# Patient Record
Sex: Female | Born: 1965 | Race: White | Hispanic: No | Marital: Married | Smoking: Never smoker
Health system: Southern US, Community
[De-identification: ages and names within clinical notes are randomized; demographics above are authoritative.]

## PROBLEM LIST (undated history)

## (undated) DIAGNOSIS — M1990 Unspecified osteoarthritis, unspecified site: Secondary | ICD-10-CM

## (undated) DIAGNOSIS — N951 Menopausal and female climacteric states: Secondary | ICD-10-CM

## (undated) DIAGNOSIS — G2581 Restless legs syndrome: Secondary | ICD-10-CM

## (undated) DIAGNOSIS — I10 Essential (primary) hypertension: Secondary | ICD-10-CM

## (undated) DIAGNOSIS — K219 Gastro-esophageal reflux disease without esophagitis: Secondary | ICD-10-CM

## (undated) HISTORY — DX: Gastro-esophageal reflux disease without esophagitis: K21.9

## (undated) HISTORY — DX: Menopausal and female climacteric states: N95.1

## (undated) HISTORY — DX: Unspecified osteoarthritis, unspecified site: M19.90

## (undated) HISTORY — DX: Essential (primary) hypertension: I10

## (undated) HISTORY — DX: Restless legs syndrome: G25.81

---

## 2019-03-12 ENCOUNTER — Encounter: Payer: Self-pay | Admitting: Nurse Practitioner

## 2019-05-08 ENCOUNTER — Other Ambulatory Visit: Payer: Self-pay | Admitting: Family Medicine

## 2019-05-08 NOTE — Telephone Encounter (Signed)
Your patient 

## 2019-06-06 ENCOUNTER — Other Ambulatory Visit: Payer: Self-pay | Admitting: Family Medicine

## 2019-07-03 ENCOUNTER — Other Ambulatory Visit: Payer: Self-pay

## 2019-07-05 ENCOUNTER — Other Ambulatory Visit: Payer: Self-pay

## 2019-08-09 ENCOUNTER — Other Ambulatory Visit: Payer: Self-pay | Admitting: Family Medicine

## 2019-09-06 ENCOUNTER — Other Ambulatory Visit: Payer: Self-pay | Admitting: Family Medicine

## 2019-09-18 ENCOUNTER — Other Ambulatory Visit: Payer: Self-pay

## 2019-09-18 ENCOUNTER — Telehealth (INDEPENDENT_AMBULATORY_CARE_PROVIDER_SITE_OTHER): Payer: Self-pay | Admitting: Family Medicine

## 2019-09-18 ENCOUNTER — Encounter: Payer: Self-pay | Admitting: Family Medicine

## 2019-09-18 ENCOUNTER — Telehealth: Payer: Self-pay

## 2019-09-18 ENCOUNTER — Other Ambulatory Visit (INDEPENDENT_AMBULATORY_CARE_PROVIDER_SITE_OTHER): Payer: Self-pay

## 2019-09-18 VITALS — Temp 97.7°F | Ht 65.0 in | Wt 235.0 lb

## 2019-09-18 DIAGNOSIS — R05 Cough: Secondary | ICD-10-CM

## 2019-09-18 DIAGNOSIS — R059 Cough, unspecified: Secondary | ICD-10-CM | POA: Insufficient documentation

## 2019-09-18 DIAGNOSIS — K219 Gastro-esophageal reflux disease without esophagitis: Secondary | ICD-10-CM | POA: Insufficient documentation

## 2019-09-18 LAB — POC COVID19 BINAXNOW: SARS Coronavirus 2 Ag: POSITIVE — AB

## 2019-09-18 MED ORDER — BENZONATATE 200 MG PO CAPS: 200.0000 mg | ORAL_CAPSULE | Freq: Two times a day (BID) | ORAL | 0 refills | Status: DC | PRN

## 2019-09-18 NOTE — Progress Notes (Signed)
Virtual Visit via Telephone Note   This visit type was conducted due to national recommendations for restrictions regarding the COVID-19 Pandemic (e.g. social distancing) in an effort to limit this patient's exposure and mitigate transmission in our community.  Due to her co-morbid illnesses, this patient is at least at moderate risk for complications without adequate follow up.  This format is felt to be most appropriate for this patient at this time.  The patient did not have access to video technology/had technical difficulties with video requiring transitioning to audio format only (telephone).  All issues noted in this document were discussed and addressed.  No physical exam could be performed with this format.  Patient verbally consented to a telehealth visit.   Date:  09/18/2019   ID:  Gina Combs, DOB [birth year], MRN [id number] Pt at home Provider in office  PCP:  Daryll Drown, NP   Evaluation Performed: Televisit  Chief Complaint: concern for COVID-stomach upset with constipation, congestion, headache.   History of Present Illness:    Gina Combs is a 54 y.o. female with URI symptoms. Patient has been exposed to Covid. Her daughter and daughter's boyfriend were both Covid + (delta variant) and their 10 days of quarantine expired on Monday. Patient started having symptoms on Saturday. She states she is unsure if she has a "stomach bug" or could possibly have Covid.   The patient doeshave symptoms concerning for COVID-19 infection (no fever or chills. Has a dry cough, some wheezing).  Lives with husband, daughter and daughter's boy friend Social History   Socioeconomic History  . Marital status: Married    Spouse name: Not on file  . Number of children: Not on file  . Years of education: Not on file  . Highest education level: Not on file  Occupational History  . Not on file  Tobacco Use  . Smoking status: Never Smoker  . Smokeless tobacco: Never Used  Substance and  Sexual Activity  . Alcohol use: Never  . Drug use: Never  . Sexual activity: Not on file  Other Topics Concern  . Not on file  Social History Narrative  . Not on file   Social Determinants of Health   Financial Resource Strain:   . Difficulty of Paying Living Expenses:   Food Insecurity:   . Worried About Programme researcher, broadcasting/film/video in the Last Year:   . Barista in the Last Year:   Transportation Needs:   . Freight forwarder (Medical):   Marland Kitchen Lack of Transportation (Non-Medical):   Physical Activity:   . Days of Exercise per Week:   . Minutes of Exercise per Session:   Stress:   . Feeling of Stress :   Social Connections:   . Frequency of Communication with Friends and Family:   . Frequency of Social Gatherings with Friends and Family:   . Attends Religious Services:   . Active Member of Clubs or Organizations:   . Attends Banker Meetings:   Marland Kitchen Marital Status:   Intimate Partner Violence:   . Fear of Current or Ex-Partner:   . Emotionally Abused:   Marland Kitchen Physically Abused:   . Sexually Abused:     Outpatient Medications Prior to Visit  Medication Sig Dispense Refill  . etodolac (LODINE) 400 MG tablet Take 400 mg by mouth 2 (two) times daily.     No facility-administered medications prior to visit.   Allergies:   Patient has no allergy information on record.  Social History   Tobacco Use  . Smoking status: Never Smoker  . Smokeless tobacco: Never Used  Substance Use Topics  . Alcohol use: Never  . Drug use: Never     Review of Systems  Constitutional: Positive for diaphoresis. Negative for chills and fever.  HENT: Positive for ear pain (Left ear-improved, seen by telehealth last week), sinus pain (sinus Pressure) and sore throat (dry throat). Negative for congestion.        Thrush  Respiratory: Positive for cough (dry) and wheezing.   Cardiovascular: Positive for chest pain (sore from coughing).  Gastrointestinal: Positive for constipation,  heartburn and nausea. Negative for diarrhea and vomiting.       Drank prune juice  Musculoskeletal: Positive for back pain.  Neurological: Positive for dizziness and headaches.  Psychiatric/Behavioral: The patient has insomnia.      Labs/Other Tests and Data Reviewed:     Wt Readings from Last 3 Encounters:  09/18/19 235 lb (106.6 kg)     Objective:    Vital Signs-no fever, bp at home130/70  ASSESSMENT & PLAN:    1. Cough Concern for COVID with + exposure-daughter lives in pts home-10 days since diagnosis-rapid test today at office-if negative confirmation testing.  COVID-19 Education: The signs and symptoms of COVID-19 were discussed with the patient and how to seek care for testing pt to come today. The importance of social distancing was discussed today. GERD-increase omeprazole to 2-20mg  today, continue gas-x. Avoid fried and fast foods.   Time:   Today, I have spent with the patient with telehealth technology discussing the above problems.    Follow Up:  COVID testing today at office Recommended COVIDvaccine if not +  Signed, Horald Pollen, CMA  09/18/2019 8:39 AM    [facility]

## 2019-09-18 NOTE — Telephone Encounter (Signed)
Patient called to state that her rib cage was sore and felt pressure (gas like) and wanted to make sure this was normal. Advised pt that it is normal to be sore from coughing but should she have SOB or chest pain to go to the ER. Patient stated she has been belching so she knows the pressure if gas related however, any other questions she may have she will contact our office.

## 2019-09-18 NOTE — Addendum Note (Signed)
Addended by: Wandra Feinstein on: 09/18/2019 04:05 PM   Modules accepted: Orders

## 2019-09-18 NOTE — Progress Notes (Signed)
Patient Name: Gina Combs Date of Birth: [birth year] MRN:  [id number]  Gina Combs is a 54 y.o. yo female presenting for COVID-19 testing.  She is being tested from the vehicle.  Gina Combs is being tested with the rapid test.  It resulted POSITIVE.  Patient aware to contact office or go to the ED with worsening symptoms.    Jacklynn Bue, LPN 22:02 AM

## 2019-10-09 ENCOUNTER — Other Ambulatory Visit: Payer: Self-pay | Admitting: Family Medicine

## 2019-10-14 ENCOUNTER — Other Ambulatory Visit: Payer: Self-pay | Admitting: Family Medicine

## 2019-12-12 ENCOUNTER — Other Ambulatory Visit: Payer: Self-pay | Admitting: Legal Medicine

## 2020-02-04 ENCOUNTER — Telehealth: Payer: Self-pay

## 2020-02-04 ENCOUNTER — Other Ambulatory Visit: Payer: Self-pay

## 2020-02-04 MED ORDER — VALSARTAN-HYDROCHLOROTHIAZIDE 80-12.5 MG PO TABS
1.0000 | ORAL_TABLET | Freq: Every day | ORAL | 0 refills | Status: DC
Start: 2020-02-04 — End: 2020-03-06

## 2020-02-04 NOTE — Telephone Encounter (Signed)
Gina Combs called to request a refill on her bp medication.  She is currently out of town with a sick family member and she is unable to come in until late in the month.  Dr. Judee Clara approved 1 month refill and follow-up before any additional refills can be given.

## 2020-02-06 NOTE — Telephone Encounter (Signed)
Pt called again leaving VM asking for status. States she is working from home and can leave VM. Please advise.

## 2020-03-06 ENCOUNTER — Other Ambulatory Visit: Payer: Self-pay

## 2020-03-06 MED ORDER — VALSARTAN-HYDROCHLOROTHIAZIDE 80-12.5 MG PO TABS
1.0000 | ORAL_TABLET | Freq: Every day | ORAL | 0 refills | Status: DC
Start: 2020-03-06 — End: 2020-04-08

## 2020-03-17 ENCOUNTER — Ambulatory Visit: Payer: Self-pay | Admitting: Nurse Practitioner

## 2020-03-23 ENCOUNTER — Encounter: Payer: Self-pay | Admitting: Family Medicine

## 2020-04-08 ENCOUNTER — Other Ambulatory Visit: Payer: Self-pay | Admitting: Family Medicine

## 2020-04-09 ENCOUNTER — Ambulatory Visit: Payer: Self-pay | Admitting: Nurse Practitioner

## 2020-04-09 ENCOUNTER — Encounter: Payer: Self-pay | Admitting: Nurse Practitioner

## 2020-04-10 ENCOUNTER — Ambulatory Visit (INDEPENDENT_AMBULATORY_CARE_PROVIDER_SITE_OTHER): Payer: 59 | Admitting: Nurse Practitioner

## 2020-04-10 ENCOUNTER — Encounter: Payer: Self-pay | Admitting: Nurse Practitioner

## 2020-04-10 ENCOUNTER — Other Ambulatory Visit: Payer: Self-pay

## 2020-04-10 VITALS — BP 162/68 | HR 76 | Temp 97.3°F | Ht 64.0 in | Wt 259.0 lb

## 2020-04-10 DIAGNOSIS — I10 Essential (primary) hypertension: Secondary | ICD-10-CM

## 2020-04-10 DIAGNOSIS — K219 Gastro-esophageal reflux disease without esophagitis: Secondary | ICD-10-CM

## 2020-04-10 DIAGNOSIS — R7301 Impaired fasting glucose: Secondary | ICD-10-CM

## 2020-04-10 DIAGNOSIS — Z1231 Encounter for screening mammogram for malignant neoplasm of breast: Secondary | ICD-10-CM

## 2020-04-10 DIAGNOSIS — F321 Major depressive disorder, single episode, moderate: Secondary | ICD-10-CM

## 2020-04-10 NOTE — Patient Instructions (Addendum)
Recommend dental exam and mammogram Continue prescription medications Return in 4 weeks for BP check Return in 66-month fasting Exercises to do While Sitting  Exercises that you do while sitting (chair exercises) can give you many of the same benefits as full exercise. Benefits include strengthening your heart, burning calories, and keeping muscles and joints healthy. Exercise can also improve your mood and help with depression and anxiety. You may benefit from chair exercises if you are unable to do standing exercises because of:  Diabetic foot pain.  Obesity.  Illness.  Arthritis.  Recovery from surgery or injury.  Breathing problems.  Balance problems.  Another type of disability. Before starting chair exercises, check with your health care provider or a physical therapist to find out how much exercise you can tolerate and which exercises are safe for you. If your health care provider approves:  Start out slowly and build up over time. Aim to work up to about 10-20 minutes for each exercise session.  Make exercise part of your daily routine.  Drink water when you exercise. Do not wait until you are thirsty. Drink every 10-15 minutes.  Stop exercising right away if you have pain, nausea, shortness of breath, or dizziness.  If you are exercising in a wheelchair, make sure to lock the wheels.  Ask your health care provider whether you can do tai chi or yoga. Many positions in these mind-body exercises can be modified to do while seated. Warm-up Before starting other exercises: 1. Sit up as straight as you can. Have your knees bent at 90 degrees, which is the shape of the capital letter "L." Keep your feet flat on the floor. 2. Sit at the front edge of your chair, if you can. 3. Pull in (tighten) the muscles in your abdomen and stretch your spine and neck as straight as you can. Hold this position for a few minutes. 4. Breathe in and out evenly. Try to concentrate on your  breathing, and relax your mind. Stretching Exercise A: Arm stretch 1. Hold your arms out straight in front of your body. 2. Bend your hands at the wrist with your fingers pointing up, as if signaling someone to stop. Notice the slight tension in your forearms as you hold the position. 3. Keeping your arms out and your hands bent, rotate your hands outward as far as you can and hold this stretch. Aim to have your thumbs pointing up and your pinkie fingers pointing down. Slowly repeat arm stretches for one minute as tolerated. Exercise B: Leg stretch 1. If you can move your legs, try to "draw" letters on the floor with the toes of your foot. Write your name with one foot. 2. Write your name with the toes of your other foot. Slowly repeat the movements for one minute as tolerated. Exercise C: Reach for the sky 1. Reach your hands as far over your head as you can to stretch your spine. 2. Move your hands and arms as if you are climbing a rope. Slowly repeat the movements for one minute as tolerated. Range of motion exercises Exercise A: Shoulder roll 1. Let your arms hang loosely at your sides. 2. Lift just your shoulders up toward your ears, then let them relax back down. 3. When your shoulders feel loose, rotate your shoulders in backward and forward circles. Do shoulder rolls slowly for one minute as tolerated. Exercise B: March in place 1. As if you are marching, pump your arms and lift your legs up  and down. Lift your knees as high as you can. ? If you are unable to lift your knees, just pump your arms and move your ankles and feet up and down. March in place for one minute as tolerated. Exercise C: Seated jumping jacks 1. Let your arms hang down straight. 2. Keeping your arms straight, lift them up over your head. Aim to point your fingers to the ceiling. 3. While you lift your arms, straighten your legs and slide your heels along the floor to your sides, as wide as you can. 4. As you  bring your arms back down to your sides, slide your legs back together. ? If you are unable to use your legs, just move your arms. Slowly repeat seated jumping jacks for one minute as tolerated. Strengthening exercises Exercise A: Shoulder squeeze 1. Hold your arms straight out from your body to your sides, with your elbows bent and your fists pointed at the ceiling. 2. Keeping your arms in the bent position, move them forward so your elbows and forearms meet in front of your face. 3. Open your arms back out as wide as you can with your elbows still bent, until you feel your shoulder blades squeezing together. Hold for 5 seconds. Slowly repeat the movements forward and backward for one minute as tolerated. Contact a health care provider if you:  Had to stop exercising due to any of the following: ? Pain. ? Nausea. ? Shortness of breath. ? Dizziness. ? Fatigue.  Have significant pain or soreness after exercising. Get help right away if you have:  Chest pain.  Difficulty breathing. These symptoms may represent a serious problem that is an emergency. Do not wait to see if the symptoms will go away. Get medical help right away. Call your local emergency services (911 in the [place] Do not drive yourself to the hospital. This information is not intended to replace advice given to you by your health care provider. Make sure you discuss any questions you have with your health care provider. Document Revised: 05/16/2019 Document Reviewed: 05/16/2019 Elsevier Patient Education  2021 [place].  [email or web address]">  DASH Eating Plan DASH stands for Dietary Approaches to Stop Hypertension. The DASH eating plan is a healthy eating plan that has been shown to:  Reduce high blood pressure (hypertension).  Reduce your risk for type 2 diabetes, heart disease, and stroke.  Help with weight loss. What are tips for following this plan? Reading  food labels  Check food labels for the amount of salt (sodium) per serving. Choose foods with less than 5 percent of the Daily Value of sodium. Generally, foods with less than 300 milligrams (mg) of sodium per serving fit into this eating plan.  To find whole grains, look for the word "whole" as the first word in the ingredient list. Shopping  Buy products labeled as "low-sodium" or "no salt added."  Buy fresh foods. Avoid canned foods and pre-made or frozen meals. Cooking  Avoid adding salt when cooking. Use salt-free seasonings or herbs instead of table salt or sea salt. Check with your health care provider or pharmacist before using salt substitutes.  Do not fry foods. Cook foods using healthy methods such as baking, boiling, grilling, roasting, and broiling instead.  Cook with heart-healthy oils, such as olive, canola, avocado, soybean, or sunflower oil. Meal planning  Eat a balanced diet that includes: ? 4 or more servings of fruits and 4 or more servings of vegetables each day. Try to fill one-half  of your plate with fruits and vegetables. ? 6-8 servings of whole grains each day. ? Less than 6 oz (170 g) of lean meat, poultry, or fish each day. A 3-oz (85-g) serving of meat is about the same size as a deck of cards. One egg equals 1 oz (28 g). ? 2-3 servings of low-fat dairy each day. One serving is 1 cup (237 mL). ? 1 serving of nuts, seeds, or beans 5 times each week. ? 2-3 servings of heart-healthy fats. Healthy fats called omega-3 fatty acids are found in foods such as walnuts, flaxseeds, fortified milks, and eggs. These fats are also found in cold-water fish, such as sardines, salmon, and mackerel.  Limit how much you eat of: ? Canned or prepackaged foods. ? Food that is high in trans fat, such as some fried foods. ? Food that is high in saturated fat, such as fatty meat. ? Desserts and other sweets, sugary drinks, and other foods with added sugar. ? Full-fat dairy  products.  Do not salt foods before eating.  Do not eat more than 4 egg yolks a week.  Try to eat at least 2 vegetarian meals a week.  Eat more home-cooked food and less restaurant, buffet, and fast food.   Lifestyle  When eating at a restaurant, ask that your food be prepared with less salt or no salt, if possible.  If you drink alcohol: ? Limit how much you use to:  0-1 drink a day for women who are not pregnant.  0-2 drinks a day for men. ? Be aware of how much alcohol is in your drink. In the [place] one drink equals one 12 oz bottle of beer (355 mL), one 5 oz glass of wine (148 mL), or one 1 oz glass of hard liquor (44 mL). General information  Avoid eating more than 2,300 mg of salt a day. If you have hypertension, you may need to reduce your sodium intake to 1,500 mg a day.  Work with your health care provider to maintain a healthy body weight or to lose weight. Ask what an ideal weight is for you.  Get at least 30 minutes of exercise that causes your heart to beat faster (aerobic exercise) most days of the week. Activities may include walking, swimming, or biking.  Work with your health care provider or dietitian to adjust your eating plan to your individual calorie needs. What foods should I eat? Fruits All fresh, dried, or frozen fruit. Canned fruit in natural juice (without added sugar). Vegetables Fresh or frozen vegetables (raw, steamed, roasted, or grilled). Low-sodium or reduced-sodium tomato and vegetable juice. Low-sodium or reduced-sodium tomato sauce and tomato paste. Low-sodium or reduced-sodium canned vegetables. Grains Whole-grain or whole-wheat bread. Whole-grain or whole-wheat pasta. Brown rice. [place]. Bulgur. Whole-grain and low-sodium cereals. Pita bread. Low-fat, low-sodium crackers. Whole-wheat flour tortillas. Meats and other proteins Skinless chicken or [place]. Ground chicken or [place]. Pork with fat trimmed off. Fish and seafood. Egg  whites. Dried beans, peas, or lentils. Unsalted nuts, nut butters, and seeds. Unsalted canned beans. Lean cuts of beef with fat trimmed off. Low-sodium, lean precooked or cured meat, such as sausages or meat loaves. Dairy Low-fat (1%) or fat-free (skim) milk. Reduced-fat, low-fat, or fat-free cheeses. Nonfat, low-sodium ricotta or cottage cheese. Low-fat or nonfat yogurt. Low-fat, low-sodium cheese. Fats and oils Soft margarine without trans fats. Vegetable oil. Reduced-fat, low-fat, or light mayonnaise and salad dressings (reduced-sodium). Canola, safflower, olive, avocado, soybean, and sunflower oils. Avocado. Seasonings  and condiments Herbs. Spices. Seasoning mixes without salt. Other foods Unsalted popcorn and pretzels. Fat-free sweets. The items listed above may not be a complete list of foods and beverages you can eat. Contact a dietitian for more information. What foods should I avoid? Fruits Canned fruit in a light or heavy syrup. Fried fruit. Fruit in cream or butter sauce. Vegetables Creamed or fried vegetables. Vegetables in a cheese sauce. Regular canned vegetables (not low-sodium or reduced-sodium). Regular canned tomato sauce and paste (not low-sodium or reduced-sodium). Regular tomato and vegetable juice (not low-sodium or reduced-sodium). Angie Fava. Olives. Grains Baked goods made with fat, such as croissants, muffins, or some breads. Dry pasta or rice meal packs. Meats and other proteins Fatty cuts of meat. Ribs. Fried meat. Berniece Salines. Bologna, salami, and other precooked or cured meats, such as sausages or meat loaves. Fat from the back of a pig (fatback). Bratwurst. Salted nuts and seeds. Canned beans with added salt. Canned or smoked fish. Whole eggs or egg yolks. Chicken or [place] with skin. Dairy Whole or 2% milk, cream, and half-and-half. Whole or full-fat cream cheese. Whole-fat or sweetened yogurt. Full-fat cheese. Nondairy creamers. Whipped toppings. Processed cheese and  cheese spreads. Fats and oils Butter. Stick margarine. Lard. Shortening. Ghee. Bacon fat. Tropical oils, such as coconut, palm kernel, or palm oil. Seasonings and condiments Onion salt, garlic salt, seasoned salt, table salt, and sea salt. Worcestershire sauce. Tartar sauce. Barbecue sauce. Teriyaki sauce. Soy sauce, including reduced-sodium. Steak sauce. Canned and packaged gravies. Fish sauce. Oyster sauce. Cocktail sauce. Store-bought horseradish. Ketchup. Mustard. Meat flavorings and tenderizers. Bouillon cubes. Hot sauces. Pre-made or packaged marinades. Pre-made or packaged taco seasonings. Relishes. Regular salad dressings. Other foods Salted popcorn and pretzels. The items listed above may not be a complete list of foods and beverages you should avoid. Contact a dietitian for more information. Where to find more information  National Heart, Lung, and Blood Institute: https://wilson-eaton.com/  American Heart Association: www.heart.org  Academy of Nutrition and Dietetics: www.eatright.[place]: www.kidney.org Summary  The DASH eating plan is a healthy eating plan that has been shown to reduce high blood pressure (hypertension). It may also reduce your risk for type 2 diabetes, heart disease, and stroke.  When on the DASH eating plan, aim to eat more fresh fruits and vegetables, whole grains, lean proteins, low-fat dairy, and heart-healthy fats.  With the DASH eating plan, you should limit salt (sodium) intake to 2,300 mg a day. If you have hypertension, you may need to reduce your sodium intake to 1,500 mg a day.  Work with your health care provider or dietitian to adjust your eating plan to your individual calorie needs. This information is not intended to replace advice given to you by your health care provider. Make sure you discuss any questions you have with your health care provider. Document Revised: 12/21/2018 Document Reviewed: 12/21/2018 Elsevier Patient  Education  2021 [place].  Managing Your Hypertension Hypertension, also called high blood pressure, is when the force of the blood pressing against the walls of the arteries is too strong. Arteries are blood vessels that carry blood from your heart throughout your body. Hypertension forces the heart to work harder to pump blood and may cause the arteries to become narrow or stiff. Understanding blood pressure readings Your personal target blood pressure may vary depending on your medical conditions, your age, and other factors. A blood pressure reading includes a higher number over a lower number. Ideally, your blood pressure  should be below 120/80. You should know that:  The first, or top, number is called the systolic pressure. It is a measure of the pressure in your arteries as your heart beats.  The second, or bottom number, is called the diastolic pressure. It is a measure of the pressure in your arteries as the heart relaxes. Blood pressure is classified into four stages. Based on your blood pressure reading, your health care provider may use the following stages to determine what type of treatment you need, if any. Systolic pressure and diastolic pressure are measured in a unit called mmHg. Normal  Systolic pressure: below 681.  Diastolic pressure: below 80. Elevated  Systolic pressure: 275-170.  Diastolic pressure: below 80. Hypertension stage 1  Systolic pressure: 017-494.  Diastolic pressure: 49-67. Hypertension stage 2  Systolic pressure: 591 or above.  Diastolic pressure: 90 or above. How can this condition affect me? Managing your hypertension is an important responsibility. Over time, hypertension can damage the arteries and decrease blood flow to important parts of the body, including the brain, heart, and kidneys. Having untreated or uncontrolled hypertension can lead to:  A heart attack.  A stroke.  A weakened blood vessel (aneurysm).  Heart  failure.  Kidney damage.  Eye damage.  Metabolic syndrome.  Memory and concentration problems.  Vascular dementia. What actions can I take to manage this condition? Hypertension can be managed by making lifestyle changes and possibly by taking medicines. Your health care provider will help you make a plan to bring your blood pressure within a normal range. Nutrition  Eat a diet that is high in fiber and potassium, and low in salt (sodium), added sugar, and fat. An example eating plan is called the Dietary Approaches to Stop Hypertension (DASH) diet. To eat this way: ? Eat plenty of fresh fruits and vegetables. Try to fill one-half of your plate at each meal with fruits and vegetables. ? Eat whole grains, such as whole-wheat pasta, brown rice, or whole-grain bread. Fill about one-fourth of your plate with whole grains. ? Eat low-fat dairy products. ? Avoid fatty cuts of meat, processed or cured meats, and poultry with skin. Fill about one-fourth of your plate with lean proteins such as fish, chicken without skin, beans, eggs, and tofu. ? Avoid pre-made and processed foods. These tend to be higher in sodium, added sugar, and fat.  Reduce your daily sodium intake. Most people with hypertension should eat less than 1,500 mg of sodium a day.   Lifestyle  Work with your health care provider to maintain a healthy body weight or to lose weight. Ask what an ideal weight is for you.  Get at least 30 minutes of exercise that causes your heart to beat faster (aerobic exercise) most days of the week. Activities may include walking, swimming, or biking.  Include exercise to strengthen your muscles (resistance exercise), such as weight lifting, as part of your weekly exercise routine. Try to do these types of exercises for 30 minutes at least 3 days a week.  Do not use any products that contain nicotine or tobacco, such as cigarettes, e-cigarettes, and chewing tobacco. If you need help quitting, ask  your health care provider.  Control any long-term (chronic) conditions you have, such as high cholesterol or diabetes.  Identify your sources of stress and find ways to manage stress. This may include meditation, deep breathing, or making time for fun activities.   Alcohol use  Do not drink alcohol if: ? Your health care  provider tells you not to drink. ? You are pregnant, may be pregnant, or are planning to become pregnant.  If you drink alcohol: ? Limit how much you use to:  0-1 drink a day for women.  0-2 drinks a day for men. ? Be aware of how much alcohol is in your drink. In the [place] one drink equals one 12 oz bottle of beer (355 mL), one 5 oz glass of wine (148 mL), or one 1 oz glass of hard liquor (44 mL). Medicines Your health care provider may prescribe medicine if lifestyle changes are not enough to get your blood pressure under control and if:  Your systolic blood pressure is 130 or higher.  Your diastolic blood pressure is 80 or higher. Take medicines only as told by your health care provider. Follow the directions carefully. Blood pressure medicines must be taken as told by your health care provider. The medicine does not work as well when you skip doses. Skipping doses also puts you at risk for problems. Monitoring Before you monitor your blood pressure:  Do not smoke, drink caffeinated beverages, or exercise within 30 minutes before taking a measurement.  Use the bathroom and empty your bladder (urinate).  Sit quietly for at least 5 minutes before taking measurements. Monitor your blood pressure at home as told by your health care provider. To do this:  Sit with your back straight and supported.  Place your feet flat on the floor. Do not cross your legs.  Support your arm on a flat surface, such as a table. Make sure your upper arm is at heart level.  Each time you measure, take two or three readings one minute apart and record the results. You may also  need to have your blood pressure checked regularly by your health care provider.   General information  Talk with your health care provider about your diet, exercise habits, and other lifestyle factors that may be contributing to hypertension.  Review all the medicines you take with your health care provider because there may be side effects or interactions.  Keep all visits as told by your health care provider. Your health care provider can help you create and adjust your plan for managing your high blood pressure. Where to find more information  National Heart, Lung, and Blood Institute: https://wilson-eaton.com/  American Heart Association: www.heart.org Contact a health care provider if:  You think you are having a reaction to medicines you have taken.  You have repeated (recurrent) headaches.  You feel dizzy.  You have swelling in your ankles.  You have trouble with your vision. Get help right away if:  You develop a severe headache or confusion.  You have unusual weakness or numbness, or you feel faint.  You have severe pain in your chest or abdomen.  You vomit repeatedly.  You have trouble breathing. These symptoms may represent a serious problem that is an emergency. Do not wait to see if the symptoms will go away. Get medical help right away. Call your local emergency services (911 in the [place] Do not drive yourself to the hospital. Summary  Hypertension is when the force of blood pumping through your arteries is too strong. If this condition is not controlled, it may put you at risk for serious complications.  Your personal target blood pressure may vary depending on your medical conditions, your age, and other factors. For most people, a normal blood pressure is less than 120/80.  Hypertension is managed by lifestyle changes,  medicines, or both.  Lifestyle changes to help manage hypertension include losing weight, eating a healthy, low-sodium diet, exercising more,  stopping smoking, and limiting alcohol. This information is not intended to replace advice given to you by your health care provider. Make sure you discuss any questions you have with your health care provider. Document Revised: 02/22/2019 Document Reviewed: 12/18/2018 Elsevier Patient Education  2021 [employer] Inc.  Preventive Care 65-39 Years Old, Female Preventive care refers to lifestyle choices and visits with your health care provider that can promote health and wellness. This includes:  A yearly physical exam. This is also called an annual wellness visit.  Regular dental and eye exams.  Immunizations.  Screening for certain conditions.  Healthy lifestyle choices, such as: ? Eating a healthy diet. ? Getting regular exercise. ? Not using drugs or products that contain nicotine and tobacco. ? Limiting alcohol use. What can I expect for my preventive care visit? Physical exam Your health care provider will check your:  Height and weight. These may be used to calculate your BMI (body mass index). BMI is a measurement that tells if you are at a healthy weight.  Heart rate and blood pressure.  Body temperature.  Skin for abnormal spots. Counseling Your health care provider may ask you questions about your:  Past medical problems.  Family's medical history.  Alcohol, tobacco, and drug use.  Emotional well-being.  Home life and relationship well-being.  Sexual activity.  Diet, exercise, and sleep habits.  Work and work Statistician.  Access to firearms.  Method of birth control.  Menstrual cycle.  Pregnancy history. What immunizations do I need? Vaccines are usually given at various ages, according to a schedule. Your health care provider will recommend vaccines for you based on your age, medical history, and lifestyle or other factors, such as travel or where you work.   What tests do I need? Blood tests  Lipid and cholesterol levels. These may be checked  every 5 years, or more often if you are over 23 years old.  Hepatitis C test.  Hepatitis B test. Screening  Lung cancer screening. You may have this screening every year starting at age 31 if you have a 30-pack-year history of smoking and currently smoke or have quit within the past 15 years.  Colorectal cancer screening. ? All adults should have this screening starting at age 38 and continuing until age 92. ? Your health care provider may recommend screening at age 72 if you are at increased risk. ? You will have tests every 1-10 years, depending on your results and the type of screening test.  Diabetes screening. ? This is done by checking your blood sugar (glucose) after you have not eaten for a while (fasting). ? You may have this done every 1-3 years.  Mammogram. ? This may be done every 1-2 years. ? Talk with your health care provider about when you should start having regular mammograms. This may depend on whether you have a family history of breast cancer.  BRCA-related cancer screening. This may be done if you have a family history of breast, ovarian, tubal, or peritoneal cancers.  Pelvic exam and Pap test. ? This may be done every 3 years starting at age 40. ? Starting at age 44, this may be done every 5 years if you have a Pap test in combination with an HPV test. Other tests  STD (sexually transmitted disease) testing, if you are at risk.  Bone density scan. This is done  to screen for osteoporosis. You may have this scan if you are at high risk for osteoporosis. Talk with your health care provider about your test results, treatment options, and if necessary, the need for more tests. Follow these instructions at home: Eating and drinking  Eat a diet that includes fresh fruits and vegetables, whole grains, lean protein, and low-fat dairy products.  Take vitamin and mineral supplements as recommended by your health care provider.  Do not drink alcohol if: ? Your  health care provider tells you not to drink. ? You are pregnant, may be pregnant, or are planning to become pregnant.  If you drink alcohol: ? Limit how much you have to 0-1 drink a day. ? Be aware of how much alcohol is in your drink. In the [place] one drink equals one 12 oz bottle of beer (355 mL), one 5 oz glass of wine (148 mL), or one 1 oz glass of hard liquor (44 mL).   Lifestyle  Take daily care of your teeth and gums. Brush your teeth every morning and night with fluoride toothpaste. Floss one time each day.  Stay active. Exercise for at least 30 minutes 5 or more days each week.  Do not use any products that contain nicotine or tobacco, such as cigarettes, e-cigarettes, and chewing tobacco. If you need help quitting, ask your health care provider.  Do not use drugs.  If you are sexually active, practice safe sex. Use a condom or other form of protection to prevent STIs (sexually transmitted infections).  If you do not wish to become pregnant, use a form of birth control. If you plan to become pregnant, see your health care provider for a prepregnancy visit.  If told by your health care provider, take low-dose aspirin daily starting at age 72.  Find healthy ways to cope with stress, such as: ? Meditation, yoga, or listening to music. ? Journaling. ? Talking to a trusted person. ? Spending time with friends and family. Safety  Always wear your seat belt while driving or riding in a vehicle.  Do not drive: ? If you have been drinking alcohol. Do not ride with someone who has been drinking. ? When you are tired or distracted. ? While texting.  Wear a helmet and other protective equipment during sports activities.  If you have firearms in your house, make sure you follow all gun safety procedures. What's next?  Visit your health care provider once a year for an annual wellness visit.  Ask your health care provider how often you should have your eyes and teeth  checked.  Stay up to date on all vaccines. This information is not intended to replace advice given to you by your health care provider. Make sure you discuss any questions you have with your health care provider. Document Revised: 10/22/2019 Document Reviewed: 09/28/2017 Elsevier Patient Education  2021 [employer].

## 2020-04-10 NOTE — Progress Notes (Signed)
Established Patient Office Visit  Subjective:  Patient ID: Gina Combs, female    DOB: 02-05-1965  Age: 55 y.o. MRN: [id number]  CC: F/U of HTN and GERD   HPI Gina Combs is a 55 year old Caucasian female that presents for follow-up of hypertension, GERD, and depression. She has not been seen in the clinic in over 1-year. She has had telemedicine visits due to COVID-19. She was positive for COVID-19 in Aug 2021. She states she still experiences fatigue. She is not up-to-date on preventative screenings such as mammogram or colonoscopy. She is overdue for eye and dental exams. She tells me she has poor dentition with multiple dental caries. States she was told to return to dentist for full mouth extraction and denture fitting when she is ready. States she has been receiving bilateral total knee injections for chronic osteoarthritis at [facility]. States she was told she will need bilateral total knee arthroplasty surgeries eventually.    Hypertension, Follow up: Aleja has a history of hypertension since 2016. BP elevated initially in clinic today 162/68. Recheck of BP 128/68. Current treatment includes Valsartan-HCTZ 80-12.5 mg daily. She is obese. She has difficulty exercising due to limitations with advanced bilateral knee OA. 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 She is not currently consuming a heart healthy diet. Admits to stress eating. Education provided concerning heart healthy diet and chair exercises.   She was last seen for hypertension 2 years ago.  BP at that visit was 142/78 on 01/22/2018.   She reports good compliance with treatment. She is not having side effects.  She is following a Regular diet. She is not exercising. She does not smoke.  Use of agents associated with hypertension: NSAIDS.   Outside blood pressures are 120s-140's/ 50's-60's Symptoms: No chest pain No chest pressure  No palpitations No syncope  No dyspnea No orthopnea  No paroxysmal nocturnal  dyspnea No lower extremity edema   Pertinent labs: Lab Results  Component Value Date   CHOL 176 04/10/2020   HDL 43 04/10/2020   LDLCALC 112 (H) 04/10/2020   TRIG 117 04/10/2020   CHOLHDL 4.1 04/10/2020   Lab Results  Component Value Date   NA 144 04/10/2020   K 3.6 04/10/2020   CREATININE 1.10 (H) 04/10/2020   GLUCOSE 128 (H) 04/10/2020     The 10-year ASCVD risk score Denman George DC Jr., et al., 2013) is: 5.2%     GERD, Follow up:  The patient was last seen for GERD 3 years ago. Changes made since that visit include Prilosec 20 mg daily.  She reports fair compliance with treatment. She is not having side effects.Avoids food triggers. Does not lie down immediately after meals.  She IS experiencing bilious reflux. She is NOT experiencing chest pain or cough  Depression, Follow-up  She  was last seen for this 2 years ago. Changes made at last visit include new job change.   She reports fair compliance with treatment. She is not having side effects.   She reports good tolerance of treatment. Current symptoms include: weight gain She feels she is Unchanged since last visit.  Depression screen [facility] 2/9 04/10/2020  Decreased Interest 0  Down, Depressed, Hopeless 0  PHQ - 2 Score 0  Altered sleeping 0  Tired, decreased energy 0  Change in appetite 0  Feeling bad or failure about yourself  0  Trouble concentrating 0  Moving slowly or fidgety/restless 0  Suicidal thoughts 0  PHQ-9 Score 0  Difficult doing  work/chores Not difficult at all      Past Medical History:  Diagnosis Date  . Essential hypertension   . GERD (gastroesophageal reflux disease)   . Menopausal and female climacteric states   . RLS (restless legs syndrome)     No past surgical history on file.  Family History  Problem Relation Age of Onset  . Cancer Maternal Grandmother        ovarian  . Cancer Maternal Aunt        ovarian    Social History   Socioeconomic History  . Marital status:  Married    Spouse name: Not on file  . Number of children: 2  . Years of education: Not on file  . Highest education level: Not on file  Occupational History  . Not on file  Tobacco Use  . Smoking status: Never Smoker  . Smokeless tobacco: Never Used  Vaping Use  . Vaping Use: Never used  Substance and Sexual Activity  . Alcohol use: Never  . Drug use: Never  . Sexual activity: Not on file  Other Topics Concern  . Not on file  Social History Narrative   ** Merged History Encounter **       Social Determinants of Health   Financial Resource Strain: Not on file  Food Insecurity: Not on file  Transportation Needs: Not on file  Physical Activity: Not on file  Stress: Not on file  Social Connections: Not on file  Intimate Partner Violence: Not on file    Outpatient Medications Prior to Visit  Medication Sig Dispense Refill  . albuterol (VENTOLIN HFA) 108 (90 Base) MCG/ACT inhaler 2-4 puffs Q4 PRN SOB or tight cough    . CRANBERRY EXTRACT PO Take by mouth.    . Nutritional Supplements (VITAMIN D BOOSTER PO) Take by mouth.    Marland Kitchen omeprazole (PRILOSEC) 20 MG capsule Take 20 mg by mouth daily.    . Probiotic Product (PROBIOTIC ADVANCED PO) Take by mouth.    . sertraline (ZOLOFT) 25 MG tablet Take 1 tablet (25 mg total) by mouth daily. Please call to set up a follow up appointment in June 2021. Thank you, Dr Sedalia Muta 90 tablet 0  . valsartan-hydrochlorothiazide (DIOVAN-HCT) 80-12.5 MG tablet Take 1 tablet by mouth daily. 30 tablet 0  . Ascorbic Acid (VITAMIN C ER PO) Take by mouth.    . benzonatate (TESSALON) 200 MG capsule Take 1 capsule (200 mg total) by mouth 2 (two) times daily as needed for cough. 20 capsule 0  . ZINC CITRATE PO Take by mouth.     No facility-administered medications prior to visit.    Allergies  Allergen Reactions  . Codeine Hives  . Penicillins Hives    ROS Review of Systems  Constitutional: Positive for fatigue. Negative for appetite change and  unexpected weight change.  HENT: Negative for congestion, ear pain, rhinorrhea, sinus pressure, sinus pain and tinnitus.   Eyes: Negative for pain.  Respiratory: Negative for cough and shortness of breath.   Cardiovascular: Negative for chest pain, palpitations and leg swelling.  Gastrointestinal: Negative for abdominal pain, constipation, diarrhea, nausea and vomiting.       GERD  Endocrine: Negative for cold intolerance, heat intolerance, polydipsia, polyphagia and polyuria.  Genitourinary: Negative for dysuria, frequency and hematuria.  Musculoskeletal: Positive for arthralgias (Chronic bilateral knee pain) and joint swelling (bilateral knees). Negative for back pain and myalgias.  Skin: Negative for rash.  Allergic/Immunologic: Negative for environmental allergies.  Neurological: Negative for  dizziness and headaches.  Hematological: Negative for adenopathy.  Psychiatric/Behavioral: Negative for decreased concentration and sleep disturbance. The patient is not nervous/anxious.       Objective:    Physical Exam Vitals reviewed.  Constitutional:      Appearance: She is obese.  HENT:     Head: Normocephalic.     Right Ear: Tympanic membrane normal.     Left Ear: Tympanic membrane normal.     Nose: Nose normal.     Mouth/Throat:     Mouth: Mucous membranes are moist.     Comments: Poor dentition, multiple broken teeth and dental caries Eyes:     Pupils: Pupils are equal, round, and reactive to light.  Neck:     Vascular: No carotid bruit.  Cardiovascular:     Rate and Rhythm: Normal rate and regular rhythm.     Pulses: Normal pulses.     Heart sounds: Normal heart sounds.  Pulmonary:     Effort: Pulmonary effort is normal.     Breath sounds: Normal breath sounds.  Abdominal:     General: Bowel sounds are normal.     Palpations: Abdomen is soft.     Tenderness: There is no abdominal tenderness. There is no guarding.  Musculoskeletal:        General: Tenderness (bilateral  knees) present. No swelling.  Skin:    General: Skin is warm and dry.     Capillary Refill: Capillary refill takes less than 2 seconds.     Comments: Multiple moles on back ,chest, and bilateral shoulders  Neurological:     General: No focal deficit present.     Mental Status: She is alert and oriented to person, place, and time.  Psychiatric:        Mood and Affect: Mood normal.        Behavior: Behavior normal.     Wt Readings from Last 3 Encounters:  09/18/19 235 lb (106.6 kg)     Health Maintenance Due  Topic Date Due  . Hepatitis C Screening  Never done  . COVID-19 Vaccine (1) Never done  . HIV Screening  Never done  . TETANUS/TDAP  Never done  . PAP SMEAR-Modifier  Never done  . COLONOSCOPY (Pts 45-6yrs Insurance coverage will need to be confirmed)  Never done  . MAMMOGRAM  Never done  . INFLUENZA VACCINE  Never done      Assessment & Plan:    1. Primary hypertension-Not at goal - CBC with Differential/Platelet - Comprehensive metabolic panel - TSH - Lipid panel -Continue Diovan 40-12.5 mg daily  2. Encounter for screening mammogram for malignant neoplasm of breast - MM DIGITAL SCREENING BILATERAL  3. Uncontrolled hypertension -Heart healthy DASH diet -Increase physical activity -Monitor BP at home, keep log -Return in 4 weeks for BP check  4. Impaired fasting glucose -Add HgbA1C to labs -Decrease concentrated sweets and sugary beverages in diet -Decrease   5. GERD-well controlled -Continue Prilosec 20 mg daily -Avoid foods that trigger GERD symptoms  6. Depression-well controlled -Continue Zoloft 25 mg daily      Follow-up: 4-weeks BP recheck, 2 week CMP redraw  Signed, Janie Morning, NP

## 2020-04-11 LAB — COMPREHENSIVE METABOLIC PANEL
ALT: 14 IU/L (ref 0–32)
AST: 13 IU/L (ref 0–40)
Albumin/Globulin Ratio: 1.6 (ref 1.2–2.2)
Albumin: 4.1 g/dL (ref 3.8–4.9)
Alkaline Phosphatase: 164 IU/L — ABNORMAL HIGH (ref 44–121)
BUN/Creatinine Ratio: 10 (ref 9–23)
BUN: 11 mg/dL (ref 6–24)
Bilirubin Total: 0.3 mg/dL (ref 0.0–1.2)
CO2: 28 mmol/L (ref 20–29)
Calcium: 9.8 mg/dL (ref 8.7–10.2)
Chloride: 99 mmol/L (ref 96–106)
Creatinine, Ser: 1.1 mg/dL — ABNORMAL HIGH (ref 0.57–1.00)
Globulin, Total: 2.6 g/dL (ref 1.5–4.5)
Glucose: 128 mg/dL — ABNORMAL HIGH (ref 65–99)
Potassium: 3.6 mmol/L (ref 3.5–5.2)
Sodium: 144 mmol/L (ref 134–144)
Total Protein: 6.7 g/dL (ref 6.0–8.5)
eGFR: 59 mL/min/{1.73_m2} — ABNORMAL LOW (ref >59)

## 2020-04-11 LAB — CBC WITH DIFFERENTIAL/PLATELET
Basophils Absolute: 0 10*3/uL (ref 0.0–0.2)
Basos: 0 %
EOS (ABSOLUTE): 0.2 10*3/uL (ref 0.0–0.4)
Eos: 2 %
Hematocrit: 39.3 % (ref 34.0–46.6)
Hemoglobin: 12.7 g/dL (ref 11.1–15.9)
Immature Grans (Abs): 0 10*3/uL (ref 0.0–0.1)
Immature Granulocytes: 0 %
Lymphocytes Absolute: 1.7 10*3/uL (ref 0.7–3.1)
Lymphs: 19 %
MCH: 28.5 pg (ref 26.6–33.0)
MCHC: 32.3 g/dL (ref 31.5–35.7)
MCV: 88 fL (ref 79–97)
Monocytes Absolute: 0.6 10*3/uL (ref 0.1–0.9)
Monocytes: 6 %
Neutrophils Absolute: 6.4 10*3/uL (ref 1.4–7.0)
Neutrophils: 73 %
Platelets: 264 10*3/uL (ref 150–450)
RBC: 4.45 x10E6/uL (ref 3.77–5.28)
RDW: 13.9 % (ref 11.7–15.4)
WBC: 9 10*3/uL (ref 3.4–10.8)

## 2020-04-11 LAB — CARDIOVASCULAR RISK ASSESSMENT

## 2020-04-11 LAB — LIPID PANEL
Chol/HDL Ratio: 4.1 ratio (ref 0.0–4.4)
Cholesterol, Total: 176 mg/dL (ref 100–199)
HDL: 43 mg/dL (ref >39)
LDL Chol Calc (NIH): 112 mg/dL — ABNORMAL HIGH (ref 0–99)
Triglycerides: 117 mg/dL (ref 0–149)
VLDL Cholesterol Cal: 21 mg/dL (ref 5–40)

## 2020-04-11 LAB — TSH: TSH: 3.58 u[IU]/mL (ref 0.450–4.500)

## 2020-04-12 ENCOUNTER — Encounter: Payer: Self-pay | Admitting: Nurse Practitioner

## 2020-04-12 ENCOUNTER — Other Ambulatory Visit: Payer: Self-pay | Admitting: Nurse Practitioner

## 2020-04-12 MED ORDER — VALSARTAN-HYDROCHLOROTHIAZIDE 80-12.5 MG PO TABS: 1.0000 | ORAL_TABLET | Freq: Every day | ORAL | 0 refills | Status: DC

## 2020-04-12 MED ORDER — SERTRALINE HCL 25 MG PO TABS
25.0000 mg | ORAL_TABLET | Freq: Every day | ORAL | 0 refills | Status: AC
Start: 2020-04-12 — End: ?

## 2020-04-14 ENCOUNTER — Telehealth: Payer: Self-pay | Admitting: Nurse Practitioner

## 2020-04-14 NOTE — Telephone Encounter (Signed)
Unable to locate cologuard results. Pt notified via phone by staff. She has declined screening colonoscopy or cologuard at this time.

## 2020-04-28 ENCOUNTER — Ambulatory Visit: Payer: 59

## 2020-04-28 DIAGNOSIS — R799 Abnormal finding of blood chemistry, unspecified: Secondary | ICD-10-CM

## 2020-04-29 ENCOUNTER — Other Ambulatory Visit: Payer: Self-pay | Admitting: Nurse Practitioner

## 2020-04-29 DIAGNOSIS — E876 Hypokalemia: Secondary | ICD-10-CM

## 2020-04-29 LAB — COMPREHENSIVE METABOLIC PANEL
ALT: 17 IU/L (ref 0–32)
AST: 20 IU/L (ref 0–40)
Albumin/Globulin Ratio: 1.5 (ref 1.2–2.2)
Albumin: 4 g/dL (ref 3.8–4.9)
Alkaline Phosphatase: 146 IU/L — ABNORMAL HIGH (ref 44–121)
BUN/Creatinine Ratio: 11 (ref 9–23)
BUN: 11 mg/dL (ref 6–24)
Bilirubin Total: 0.3 mg/dL (ref 0.0–1.2)
CO2: 27 mmol/L (ref 20–29)
Calcium: 9.6 mg/dL (ref 8.7–10.2)
Chloride: 97 mmol/L (ref 96–106)
Creatinine, Ser: 1.03 mg/dL — ABNORMAL HIGH (ref 0.57–1.00)
Globulin, Total: 2.6 g/dL (ref 1.5–4.5)
Glucose: 124 mg/dL — ABNORMAL HIGH (ref 65–99)
Potassium: 3.2 mmol/L — ABNORMAL LOW (ref 3.5–5.2)
Sodium: 142 mmol/L (ref 134–144)
Total Protein: 6.6 g/dL (ref 6.0–8.5)
eGFR: 63 mL/min/{1.73_m2} (ref >59)

## 2020-04-29 MED ORDER — POTASSIUM CHLORIDE CRYS ER 10 MEQ PO TBCR: 20.0000 meq | EXTENDED_RELEASE_TABLET | Freq: Two times a day (BID) | ORAL | 2 refills | Status: DC

## 2020-05-06 LAB — SPECIMEN STATUS REPORT

## 2020-05-06 LAB — HGB A1C W/O EAG: Hgb A1c MFr Bld: 5.8 % — ABNORMAL HIGH (ref 4.8–5.6)

## 2020-05-26 ENCOUNTER — Other Ambulatory Visit: Payer: Self-pay

## 2020-05-26 MED ORDER — POTASSIUM CHLORIDE CRYS ER 10 MEQ PO TBCR: 20.0000 meq | EXTENDED_RELEASE_TABLET | Freq: Two times a day (BID) | ORAL | 2 refills | Status: DC

## 2020-06-25 ENCOUNTER — Other Ambulatory Visit: Payer: Self-pay

## 2020-06-25 MED ORDER — POTASSIUM CHLORIDE CRYS ER 10 MEQ PO TBCR: 20.0000 meq | EXTENDED_RELEASE_TABLET | Freq: Two times a day (BID) | ORAL | 0 refills | Status: DC

## 2020-07-13 ENCOUNTER — Ambulatory Visit: Payer: 59 | Admitting: Nurse Practitioner

## 2020-08-07 NOTE — Progress Notes (Signed)
Cancelled.  

## 2020-08-10 ENCOUNTER — Other Ambulatory Visit: Payer: Self-pay

## 2020-08-10 ENCOUNTER — Ambulatory Visit (INDEPENDENT_AMBULATORY_CARE_PROVIDER_SITE_OTHER): Payer: 59 | Admitting: Nurse Practitioner

## 2020-08-10 DIAGNOSIS — I10 Essential (primary) hypertension: Secondary | ICD-10-CM

## 2020-08-10 DIAGNOSIS — F321 Major depressive disorder, single episode, moderate: Secondary | ICD-10-CM

## 2020-08-10 DIAGNOSIS — E876 Hypokalemia: Secondary | ICD-10-CM

## 2020-08-10 DIAGNOSIS — K219 Gastro-esophageal reflux disease without esophagitis: Secondary | ICD-10-CM

## 2020-08-10 DIAGNOSIS — R7301 Impaired fasting glucose: Secondary | ICD-10-CM

## 2020-08-10 MED ORDER — VALSARTAN-HYDROCHLOROTHIAZIDE 80-12.5 MG PO TABS: 1.0000 | ORAL_TABLET | Freq: Every day | ORAL | 0 refills | Status: DC

## 2020-08-10 NOTE — Telephone Encounter (Signed)
Pt had to cancel and resched appointment for today due to loss of family member. Pt will be traveling out of state. Needs refill before leaving.   Lorita Officer, CCMA 08/10/20 8:05 AM

## 2020-08-11 ENCOUNTER — Other Ambulatory Visit: Payer: Self-pay | Admitting: Nurse Practitioner

## 2020-08-11 ENCOUNTER — Encounter: Payer: Self-pay | Admitting: Nurse Practitioner

## 2020-08-11 DIAGNOSIS — I10 Essential (primary) hypertension: Secondary | ICD-10-CM

## 2020-08-11 MED ORDER — VALSARTAN-HYDROCHLOROTHIAZIDE 80-12.5 MG PO TABS: 1.0000 | ORAL_TABLET | Freq: Every day | ORAL | 0 refills | Status: DC

## 2020-09-09 NOTE — Progress Notes (Signed)
Subjective:  Patient ID: Gina Combs, female    DOB: 04/30/1965  Age: 55 y.o. MRN: [id number]  Chief Complaint  Patient presents with   Hypertension   HPI Gina Combs is a 55 year old Caucasian female that presents for follow-up of hypertension, Vit D deficiency, and GERD. She has lost 16 pounds intentionally since last visit 41-months ago. She states she has an upcoming eye appointment. She is due for screening mammogram. Gina Combs tells me that she has a non-pruritc red rash under right axilla.    Hypertension, follow-up  She was last seen for hypertension 3 months ago.  BP at that visit was 162/68. Management since that visit includes Valsartan/HCTZ 80-12.5 mg daily.  She reports excellent compliance with treatment. She is not having side effects.  She is following a Low Sodium diet. She is exercising. She does not smoke.  Use of agents associated with hypertension: NSAIDS.   Outside blood pressures are not being checked. Symptoms: No chest pain No chest pressure  No palpitations No syncope  No dyspnea No orthopnea  No paroxysmal nocturnal dyspnea No lower extremity edema   Pertinent labs: Lab Results  Component Value Date   CHOL 176 04/10/2020   HDL 43 04/10/2020   LDLCALC 112 (H) 04/10/2020   TRIG 117 04/10/2020   CHOLHDL 4.1 04/10/2020   Lab Results  Component Value Date   NA 142 04/28/2020   K 3.2 (L) 04/28/2020   CREATININE 1.03 (H) 04/28/2020   GLUCOSE 124 (H) 04/28/2020     The 10-year ASCVD risk score Denman George DC Jr., et al., 2013) is: 3.8%    Prediabetes, Follow-up  Lab Results  Component Value Date   HGBA1C 5.8 (H) 04/10/2020   GLUCOSE 124 (H) 04/28/2020   GLUCOSE 128 (H) 04/10/2020    Last seen for for this3 months ago.  Management since that visit includes diet and exercise. Current symptoms include hyperglycemia and have been stable.  Prior visit with dietician: no Current diet: in general, a "healthy" diet   Current exercise:  chair  exercises   GERD, Follow up:  The patient was last seen for GERD 3 months ago. Current treatment is Prilosec 20 mg daily  She reports excellent compliance with treatment. She is not having side effects.  She IS experiencing  no current symptoms . She is NOT experiencing belching and eructation   Pertinent Labs:    Component Value Date/Time   CHOL 176 04/10/2020 0912   TRIG 117 04/10/2020 0912   CHOLHDL 4.1 04/10/2020 0912   CREATININE 1.03 (H) 04/28/2020 1536     Wt Readings from Last 3 Encounters:  09/10/20 243 lb (110.2 kg)  04/10/20 259 lb (117.5 kg)  09/18/19 235 lb (106.6 kg)     Current Outpatient Medications on File Prior to Visit  Medication Sig Dispense Refill   albuterol (VENTOLIN HFA) 108 (90 Base) MCG/ACT inhaler 2-4 puffs Q4 PRN SOB or tight cough     CRANBERRY EXTRACT PO Take by mouth.     Nutritional Supplements (VITAMIN D BOOSTER PO) Take by mouth.     omeprazole (PRILOSEC) 20 MG capsule Take 20 mg by mouth daily.     POTASSIUM PO Take by mouth.     Probiotic Product (PROBIOTIC ADVANCED PO) Take by mouth.     sertraline (ZOLOFT) 25 MG tablet Take 1 tablet (25 mg total) by mouth daily. Please call to set up a follow up appointment in June 2021. Thank you, Dr Sedalia Muta 90 tablet 0  valsartan-hydrochlorothiazide (DIOVAN-HCT) 80-12.5 MG tablet Take 1 tablet by mouth daily. 21 tablet 0   No current facility-administered medications on file prior to visit.   Past Medical History:  Diagnosis Date   Arthritis    bilateral knee osteoarthritis   Essential hypertension    GERD (gastroesophageal reflux disease)    Menopausal and female climacteric states    RLS (restless legs syndrome)    History reviewed. No pertinent surgical history.  Family History  Problem Relation Age of Onset   Cancer Maternal Grandmother        ovarian   Cancer Maternal Aunt        ovarian   Social History   Socioeconomic History   Marital status: Married    Spouse name: Not on  file   Number of children: 2   Years of education: Not on file   Highest education level: Not on file  Occupational History   Not on file  Tobacco Use   Smoking status: Never   Smokeless tobacco: Never  Vaping Use   Vaping Use: Never used  Substance and Sexual Activity   Alcohol use: Never   Drug use: Never   Sexual activity: Not on file  Other Topics Concern   Not on file  Social History Narrative   ** Merged History Encounter **       Social Determinants of Health   Financial Resource Strain: Not on file  Food Insecurity: Not on file  Transportation Needs: Not on file  Physical Activity: Not on file  Stress: Not on file  Social Connections: Not on file    Review of Systems  Constitutional:  Negative for appetite change, fatigue and fever.  HENT:  Positive for ear pain (right ear tenderness). Negative for congestion, sinus pressure and sore throat.   Eyes:  Positive for visual disturbance (prescription glasses). Negative for pain.  Respiratory:  Negative for cough, chest tightness, shortness of breath and wheezing.   Cardiovascular:  Negative for chest pain and palpitations.  Gastrointestinal:  Negative for abdominal pain, constipation, diarrhea, nausea and vomiting.  Endocrine: Negative.   Genitourinary:  Negative for dysuria and hematuria.  Musculoskeletal:  Positive for arthralgias (bilateral knees, polyarthritis) and joint swelling (bilateral knees). Negative for back pain and myalgias.  Skin:  Positive for rash (right axilla).  Allergic/Immunologic: Negative.   Neurological:  Negative for dizziness, weakness and headaches.  Hematological: Negative.   Psychiatric/Behavioral:  Positive for sleep disturbance (insomnia). Negative for dysphoric Combs. The patient is not nervous/anxious.     Objective:  BP 132/68 (BP Location: Left Arm, Patient Position: Sitting)   Pulse 88   Temp 97.8 F (36.6 C) (Temporal)   Ht 5\' 4"  (1.626 m)   Wt 243 lb (110.2 kg)   SpO2 98%    BMI 41.71 kg/m   BP/Weight 09/10/2020 04/10/2020 09/18/2019  Systolic BP 132 162 -  Diastolic BP 68 68 -  Wt. (Lbs) 243 259 235  BMI 41.71 44.46 39.11    Physical Exam Vitals reviewed.  Constitutional:      Appearance: Normal appearance.  HENT:     Right Ear: Tympanic membrane normal.     Left Ear: Tympanic membrane normal.     Mouth/Throat:     Mouth: Mucous membranes are moist.  Cardiovascular:     Rate and Rhythm: Normal rate and regular rhythm.     Pulses: Normal pulses.     Heart sounds: Normal heart sounds.  Pulmonary:     Effort: Pulmonary  effort is normal.     Breath sounds: Normal breath sounds.  Abdominal:     Palpations: Abdomen is soft.  Musculoskeletal:        General: Tenderness (bilateral knees) present.     Cervical back: Neck supple.  Skin:    General: Skin is warm and dry.     Capillary Refill: Capillary refill takes less than 2 seconds.     Findings: Rash (under right axilla) present.  Neurological:     General: No focal deficit present.     Mental Status: She is alert and oriented to person, place, and time.  Psychiatric:        Combs and Affect: Combs normal.        Behavior: Behavior normal.        Thought Content: Thought content normal.        Judgment: Judgment normal.     Lab Results  Component Value Date   WBC 9.0 04/10/2020   HGB 12.7 04/10/2020   HCT 39.3 04/10/2020   PLT 264 04/10/2020   GLUCOSE 124 (H) 04/28/2020   CHOL 176 04/10/2020   TRIG 117 04/10/2020   HDL 43 04/10/2020   LDLCALC 112 (H) 04/10/2020   ALT 17 04/28/2020   AST 20 04/28/2020   NA 142 04/28/2020   K 3.2 (L) 04/28/2020   CL 97 04/28/2020   CREATININE 1.03 (H) 04/28/2020   BUN 11 04/28/2020   CO2 27 04/28/2020   TSH 3.580 04/10/2020   HGBA1C 5.8 (H) 04/10/2020      Assessment & Plan:   1. Primary hypertension-well controlled - CBC With Diff/Platelet - Comprehensive metabolic panel - Lipid Panel - Vitamin D, 25-hydroxy  2. Depression, major,  single episode, moderate (HCC)-well controlled  3. Impaired fasting glucose - Hemoglobin A1c  4. Insomnia, unspecified type - traZODone (DESYREL) 50 MG tablet; Take 0.5-1 tablets (25-50 mg total) by mouth at bedtime as needed for sleep.  Dispense: 30 tablet; Refill: 3  5. Polyarthritis - celecoxib (CELEBREX) 100 MG capsule; Take 1 capsule (100 mg total) by mouth 2 (two) times daily.  Dispense: 180 capsule; Refill: 1 -follow-up with orthopedics as scheduled  6. Encounter for screening mammogram for malignant neoplasm of breast - MM DIGITAL SCREENING BILATERAL  7. Candidiasis, intertrigo - ketoconazole (NIZORAL) 2 % cream; Apply 1 application topically daily.  Dispense: 15 g; Refill: 0  -dry skin folds thoroughly after showering and apply cream to rash site  Trazodone 25-50 mg as needed for insomnia Begin Celebrex 100 mg with food for arthritis, do not take Ibuprofen, Aleve, or any other NSAIDs Practice quality sleep     Follow-up: 84-months  An After Visit Summary was printed and given to the patient.   I,Lauren M Auman,acting as a Neurosurgeon for [employer], NP.,have documented all relevant documentation on the behalf of Janie Morning, NP,as directed by  Janie Morning, NP while in the presence of Janie Morning, NP.   I, Janie Morning, NP, have reviewed all documentation for this visit. The documentation on 09/10/20 for the exam, diagnosis, procedures, and orders are all accurate and complete.    Janie Morning, NP [facility] [phone]

## 2020-09-10 ENCOUNTER — Other Ambulatory Visit: Payer: Self-pay

## 2020-09-10 ENCOUNTER — Encounter: Payer: Self-pay | Admitting: Nurse Practitioner

## 2020-09-10 ENCOUNTER — Ambulatory Visit (INDEPENDENT_AMBULATORY_CARE_PROVIDER_SITE_OTHER): Payer: 59 | Admitting: Nurse Practitioner

## 2020-09-10 ENCOUNTER — Other Ambulatory Visit: Payer: Self-pay | Admitting: Nurse Practitioner

## 2020-09-10 VITALS — BP 132/68 | HR 88 | Temp 97.8°F | Ht 64.0 in | Wt 243.0 lb

## 2020-09-10 DIAGNOSIS — B372 Candidiasis of skin and nail: Secondary | ICD-10-CM

## 2020-09-10 DIAGNOSIS — R7301 Impaired fasting glucose: Secondary | ICD-10-CM | POA: Diagnosis not present

## 2020-09-10 DIAGNOSIS — F321 Major depressive disorder, single episode, moderate: Secondary | ICD-10-CM

## 2020-09-10 DIAGNOSIS — G4700 Insomnia, unspecified: Secondary | ICD-10-CM | POA: Diagnosis not present

## 2020-09-10 DIAGNOSIS — I10 Essential (primary) hypertension: Secondary | ICD-10-CM

## 2020-09-10 DIAGNOSIS — M130 Polyarthritis, unspecified: Secondary | ICD-10-CM

## 2020-09-10 DIAGNOSIS — Z1231 Encounter for screening mammogram for malignant neoplasm of breast: Secondary | ICD-10-CM

## 2020-09-10 MED ORDER — TRAZODONE HCL 50 MG PO TABS: 25.0000 mg | ORAL_TABLET | Freq: Every evening | ORAL | 3 refills | Status: AC | PRN

## 2020-09-10 MED ORDER — CELECOXIB 100 MG PO CAPS: 100.0000 mg | ORAL_CAPSULE | Freq: Two times a day (BID) | ORAL | 1 refills | Status: AC

## 2020-09-10 MED ORDER — KETOCONAZOLE 2 % EX CREA: 1.0000 "application " | TOPICAL_CREAM | Freq: Every day | CUTANEOUS | 0 refills | Status: AC

## 2020-09-10 MED ORDER — VALSARTAN-HYDROCHLOROTHIAZIDE 80-12.5 MG PO TABS
1.0000 | ORAL_TABLET | Freq: Every day | ORAL | 1 refills | Status: DC
Start: 2020-09-10 — End: 2020-09-11

## 2020-09-10 NOTE — Patient Instructions (Addendum)
Trazodone 25-50 mg as needed for insomnia Begin Celebrex 100 mg with food for arthritis, do not take Ibuprofen, Aleve, or any other NSAIDs Practice quality sleep  Joint Pain  Joint pain can be caused by many things. It is likely to go away if you follow instructions from your doctor for taking care of yourself at home. Sometimes,you may need more treatment. Follow these instructions at home: Managing pain, stiffness, and swelling     If told, put ice on the painful area. To do this: If you have a removable elastic bandage, sling, or splint, take it off as told by your doctor. Put ice in a plastic bag. Place a towel between your skin and the bag. Leave the ice on for 20 minutes, 2-3 times a day. Take off the ice if your skin turns bright red. This is very important. If you cannot feel pain, heat, or cold, you have a greater risk of damage to the area. Move your fingers or toes below the painful joint often. Raise the painful joint above the level of your heart while you are sitting or lying down. If told, put heat on the painful area. Do this as often as told by your doctor. Use the heat source that your doctor recommends, such as a moist heat pack or a heating pad. Place a towel between your skin and the heat source. Leave the heat on for 20-30 minutes. Take off the heat if your skin gets bright red. This is especially important if you are unable to feel pain, heat, or cold. You may have a greater risk of getting burned. Activity Rest the painful joint for as long as told by your doctor. Do not do things that cause pain or make your pain worse. Begin exercising or stretching the affected area, as told by your doctor. Ask your doctor what types of exercise are safe for you. Return to your normal activities when your doctor says that it is safe. If you have an elastic bandage, sling, or splint: Wear it as told by your doctor. Take it only as told by your doctor. Loosen it your fingers or  toes below the joint: Tingle. Become numb. Get cold and blue. Keep it clean. Ask your doctor if you should take it off before bathing. If it is not waterproof: Do not let it get wet. Cover it with a watertight covering when you take a bath or shower. General instructions Take over-the-counter and prescription medicines only as told by your doctor. This may include medicines taken by mouth or applied to the skin. Do not smoke or use any products that contain nicotine or tobacco. If you need help quitting, ask your doctor. Keep all follow-up visits as told by your doctor. This is important. Contact a doctor if: You have pain that gets worse and does not get better with medicine. Your joint pain does not get better in 3 days. You have more bruising or swelling. You have a fever. You lose 10 lb (4.5 kg) or more without trying. Get help right away if: You cannot move the joint. Your fingers or toes tingle, become numb. or get cold and blue. You have a fever along with a joint that is red, warm, and swollen. Summary Joint pain can be caused by many things. It often goes away if you follow instructions from your doctor for taking care of yourself at home. Rest the painful joint for as long as told. Do not do things that cause pain  or make your pain worse. Take over-the-counter and prescription medicines only as told by your doctor. This information is not intended to replace advice given to you by your health care provider. Make sure you discuss any questions you have with your healthcare provider. Document Revised: 05/01/2019 Document Reviewed: 05/01/2019 Elsevier Patient Education  2022 Else[employer] Inc[employer]  Preventive Care 22-82 Years Old, Female Preventive care refers to lifestyle choices and visits with your health care provider that can promote health and wellness. This includes: A yearly physical exam. This is also called an annual wellness visit. Regular dental and eye  exams. Immunizations. Screening for certain conditions. Healthy lifestyle choices, such as: Eating a healthy diet. Getting regular exercise. Not using drugs or products that contain nicotine and tobacco. Limiting alcohol use. What can I expect for my preventive care visit? Physical exam Your health care provider will check your: Height and weight. These may be used to calculate your BMI (body mass index). BMI is a measurement that tells if you are at a healthy weight. Heart rate and blood pressure. Body temperature. Skin for abnormal spots. Counseling Your health care provider may ask you questions about your: Past medical problems. Family's medical history. Alcohol, tobacco, and drug use. Emotional well-being. Home life and relationship well-being. Sexual activity. Diet, exercise, and sleep habits. Work and work Statistician. Access to firearms. Method of birth control. Menstrual cycle. Pregnancy history. What immunizations do I need?  Vaccines are usually given at various ages, according to a schedule. Your health care provider will recommend vaccines for you based on your age, medicalhistory, and lifestyle or other factors, such as travel or where you work. What tests do I need? Blood tests Lipid and cholesterol levels. These may be checked every 5 years, or more often if you are over 28 years old. Hepatitis C test. Hepatitis B test. Screening Lung cancer screening. You may have this screening every year starting at age 48 if you have a 30-pack-year history of smoking and currently smoke or have quit within the past 15 years. Colorectal cancer screening. All adults should have this screening starting at age 26 and continuing until age 13. Your health care provider may recommend screening at age 34 if you are at increased risk. You will have tests every 1-10 years, depending on your results and the type of screening test. Diabetes screening. This is done by checking your  blood sugar (glucose) after you have not eaten for a while (fasting). You may have this done every 1-3 years. Mammogram. This may be done every 1-2 years. Talk with your health care provider about when you should start having regular mammograms. This may depend on whether you have a family history of breast cancer. BRCA-related cancer screening. This may be done if you have a family history of breast, ovarian, tubal, or peritoneal cancers. Pelvic exam and Pap test. This may be done every 3 years starting at age 88. Starting at age 78, this may be done every 5 years if you have a Pap test in combination with an HPV test. Other tests STD (sexually transmitted disease) testing, if you are at risk. Bone density scan. This is done to screen for osteoporosis. You may have this scan if you are at high risk for osteoporosis. Talk with your health care provider about your test results, treatment options,and if necessary, the need for more tests. Follow these instructions at home: Eating and drinking  Eat a diet that includes fresh fruits and vegetables, whole grains, lean  protein, and low-fat dairy products. Take vitamin and mineral supplements as recommended by your health care provider. Do not drink alcohol if: Your health care provider tells you not to drink. You are pregnant, may be pregnant, or are planning to become pregnant. If you drink alcohol: Limit how much you have to 0-1 drink a day. Be aware of how much alcohol is in your drink. In the [place] one drink equals one 12 oz bottle of beer (355 mL), one 5 oz glass of wine (148 mL), or one 1 oz glass of hard liquor (44 mL).  Lifestyle Take daily care of your teeth and gums. Brush your teeth every morning and night with fluoride toothpaste. Floss one time each day. Stay active. Exercise for at least 30 minutes 5 or more days each week. Do not use any products that contain nicotine or tobacco, such as cigarettes, e-cigarettes, and chewing  tobacco. If you need help quitting, ask your health care provider. Do not use drugs. If you are sexually active, practice safe sex. Use a condom or other form of protection to prevent STIs (sexually transmitted infections). If you do not wish to become pregnant, use a form of birth control. If you plan to become pregnant, see your health care provider for a prepregnancy visit. If told by your health care provider, take low-dose aspirin daily starting at age 14. Find healthy ways to cope with stress, such as: Meditation, yoga, or listening to music. Journaling. Talking to a trusted person. Spending time with friends and family. Safety Always wear your seat belt while driving or riding in a vehicle. Do not drive: If you have been drinking alcohol. Do not ride with someone who has been drinking. When you are tired or distracted. While texting. Wear a helmet and other protective equipment during sports activities. If you have firearms in your house, make sure you follow all gun safety procedures. What's next? Visit your health care provider once a year for an annual wellness visit. Ask your health care provider how often you should have your eyes and teeth checked. Stay up to date on all vaccines. This information is not intended to replace advice given to you by your health care provider. Make sure you discuss any questions you have with your healthcare provider. Document Revised: 10/22/2019 Document Reviewed: 09/28/2017 Elsevier Patient Education  [place] Maintenance, Female Adopting a healthy lifestyle and getting preventive care are important in promoting health and wellness. Ask your health care provider about: The right schedule for you to have regular tests and exams. Things you can do on your own to prevent diseases and keep yourself healthy. What should I know about diet, weight, and exercise? Eat a healthy diet  Eat a diet that includes plenty of vegetables,  fruits, low-fat dairy products, and lean protein. Do not eat a lot of foods that are high in solid fats, added sugars, or sodium.  Maintain a healthy weight Body mass index (BMI) is used to identify weight problems. It estimates body fat based on height and weight. Your health care provider can help determineyour BMI and help you achieve or maintain a healthy weight. Get regular exercise Get regular exercise. This is one of the most important things you can do for your health. Most adults should: Exercise for at least 150 minutes each week. The exercise should increase your heart rate and make you sweat (moderate-intensity exercise). Do strengthening exercises at least twice a week. This is in addition to the  moderate-intensity exercise. Spend less time sitting. Even light physical activity can be beneficial. Watch cholesterol and blood lipids Have your blood tested for lipids and cholesterol at 57 years of age, then havethis test every 5 years. Have your cholesterol levels checked more often if: Your lipid or cholesterol levels are high. You are older than 57 years of age. You are at high risk for heart disease. What should I know about cancer screening? Depending on your health history and family history, you may need to have cancer screening at various ages. This may include screening for: Breast cancer. Cervical cancer. Colorectal cancer. Skin cancer. Lung cancer. What should I know about heart disease, diabetes, and high blood pressure? Blood pressure and heart disease High blood pressure causes heart disease and increases the risk of stroke. This is more likely to develop in people who have high blood pressure readings, are of African descent, or are overweight. Have your blood pressure checked: Every 3-5 years if you are 45-74 years of age. Every year if you are 57 years old or older. Diabetes Have regular diabetes screenings. This checks your fasting blood sugar level. Have the  screening done: Once every three years after age 67 if you are at a normal weight and have a low risk for diabetes. More often and at a younger age if you are overweight or have a high risk for diabetes. What should I know about preventing infection? Hepatitis B If you have a higher risk for hepatitis B, you should be screened for this virus. Talk with your health care provider to find out if you are at risk forhepatitis B infection. Hepatitis C Testing is recommended for: Everyone born from [birth year] through 1967. Anyone with known risk factors for hepatitis C. Sexually transmitted infections (STIs) Get screened for STIs, including gonorrhea and chlamydia, if: You are sexually active and are younger than 57 years of age. You are older than 57 years of age and your health care provider tells you that you are at risk for this type of infection. Your sexual activity has changed since you were last screened, and you are at increased risk for chlamydia or gonorrhea. Ask your health care provider if you are at risk. Ask your health care provider about whether you are at high risk for HIV. Your health care provider may recommend a prescription medicine to help prevent HIV infection. If you choose to take medicine to prevent HIV, you should first get tested for HIV. You should then be tested every 3 months for as long as you are taking the medicine. Pregnancy If you are about to stop having your period (premenopausal) and you may become pregnant, seek counseling before you get pregnant. Take 400 to 800 micrograms (mcg) of folic acid every day if you become pregnant. Ask for birth control (contraception) if you want to prevent pregnancy. Osteoporosis and menopause Osteoporosis is a disease in which the bones lose minerals and strength with aging. This can result in bone fractures. If you are 60 years old or older, or if you are at risk for osteoporosis and fractures, ask your health care provider if you  should: Be screened for bone loss. Take a calcium or vitamin D supplement to lower your risk of fractures. Be given hormone replacement therapy (HRT) to treat symptoms of menopause. Follow these instructions at home: Lifestyle Do not use any products that contain nicotine or tobacco, such as cigarettes, e-cigarettes, and chewing tobacco. If you need help quitting, ask your health  care provider. Do not use street drugs. Do not share needles. Ask your health care provider for help if you need support or information about quitting drugs. Alcohol use Do not drink alcohol if: Your health care provider tells you not to drink. You are pregnant, may be pregnant, or are planning to become pregnant. If you drink alcohol: Limit how much you use to 0-1 drink a day. Limit intake if you are breastfeeding. Be aware of how much alcohol is in your drink. In the [place] one drink equals one 12 oz bottle of beer (355 mL), one 5 oz glass of wine (148 mL), or one 1 oz glass of hard liquor (44 mL). General instructions Schedule regular health, dental, and eye exams. Stay current with your vaccines. Tell your health care provider if: You often feel depressed. You have ever been abused or do not feel safe at home. Summary Adopting a healthy lifestyle and getting preventive care are important in promoting health and wellness. Follow your health care provider's instructions about healthy diet, exercising, and getting tested or screened for diseases. Follow your health care provider's instructions on monitoring your cholesterol and blood pressure. This information is not intended to replace advice given to you by your health care provider. Make sure you discuss any questions you have with your healthcare provider. Document Revised: 01/10/2018 Document Reviewed: 01/10/2018 Elsevier Patient Education  2022 [place] Sleep Information, Adult Quality sleep is important for your mental and physical  health. It also improves your quality of life. Quality sleep means you: Are asleep for most of the time you are in bed. Fall asleep within 30 minutes. Wake up no more than once a night.  Are awake for no longer than 20 minutes if you do wake up during the night. Most adults need 7-8 hours of quality sleep each night. How can poor sleep affect me? If you do not get enough quality sleep, you may have: Mood swings. Daytime sleepiness. Confusion. Decreased reaction time. Sleep disorders, such as insomnia and sleep apnea. Difficulty with: Solving problems. Coping with stress. Paying attention. These issues may affect your performance and productivity at work, school, and at home. Lack of sleep may also put you at higher risk for accidents, suicide,and risky behaviors. If you do not get quality sleep you may also be at higher risk for several health problems, including: Infections. Type 2 diabetes. Heart disease. High blood pressure. Obesity. Worsening of long-term conditions, like arthritis, kidney disease, depression, Parkinson's disease, and epilepsy. What actions can I take to get more quality sleep?     Stick to a sleep schedule. Go to sleep and wake up at about the same time each day. Do not try to sleep less on weekdays and make up for lost sleep on weekends. This does not work. Try to get about 30 minutes of exercise on most days. Do not exercise 2-3 hours before going to bed. Limit naps during the day to 30 minutes or less. Do not use any products that contain nicotine or tobacco, such as cigarettes or e-cigarettes. If you need help quitting, ask your health care provider. Do not drink caffeinated beverages for at least 8 hours before going to bed. Coffee, tea, and some sodas contain caffeine. Do not drink alcohol close to bedtime. Do not eat large meals close to bedtime. Do not take naps in the late afternoon. Try to get at least 30 minutes of sunlight every day. Morning  sunlight is best. Make time to  relax before bed. Reading, listening to music, or taking a hot bath promotes quality sleep. Make your bedroom a place that promotes quality sleep. Keep your bedroom dark, quiet, and at a comfortable room temperature. Make sure your bed is comfortable. Take out sleep distractions like TV, a computer, smartphone, and bright lights. If you are lying awake in bed for longer than 20 minutes, get up and do a relaxing activity until you feel sleepy. Work with your health care provider to treat medical conditions that may affect sleeping, such as: Nasal obstruction. Snoring. Sleep apnea and other sleep disorders. Talk to your health care provider if you think any of your prescription medicines may cause you to have difficulty falling or staying asleep. If you have sleep problems, talk with a sleep consultant. If you think you have a sleep disorder, talk with your health care provider about getting evaluated by a specialist. Where to find more information [place] website: https://sleepfoundation.org National Heart, Lung, and [place] ([place]): http://www.saunders.info/.pdf Centers for Disease Control and Prevention (CDC): LearningDermatology.pl Contact a health care provider if you: Have trouble getting to sleep or staying asleep. Often wake up very early in the morning and cannot get back to sleep. Have daytime sleepiness. Have daytime sleep attacks of suddenly falling asleep and sudden muscle weakness (narcolepsy). Have a tingling sensation in your legs with a strong urge to move your legs (restless legs syndrome). Stop breathing briefly during sleep (sleep apnea). Think you have a sleep disorder or are taking a medicine that is affecting your quality of sleep. Summary Most adults need 7-8 hours of quality sleep each night. Getting enough quality sleep is an important part of health and well-being. Make your  bedroom a place that promotes quality sleep and avoid things that may cause you to have poor sleep, such as alcohol, caffeine, smoking, and large meals. Talk to your health care provider if you have trouble falling asleep or staying asleep. This information is not intended to replace advice given to you by your health care provider. Make sure you discuss any questions you have with your healthcare provider. Document Revised: 04/26/2017 Document Reviewed: 04/26/2017 Elsevier Patient Education  2022 [place]. Insomnia Insomnia is a sleep disorder that makes it difficult to fall asleep or stay asleep. Insomnia can cause fatigue, low energy, difficulty concentrating, moodswings, and poor performance at work or school. There are three different ways to classify insomnia: Difficulty falling asleep. Difficulty staying asleep. Waking up too early in the morning. Any type of insomnia can be long-term (chronic) or short-term (acute). Both are common. Short-term insomnia usually lasts for three months or less. Chronic insomnia occurs at least three times a week for longer than threemonths. What are the causes? Insomnia may be caused by another condition, situation, or substance, such as: Anxiety. Certain medicines. Gastroesophageal reflux disease (GERD) or other gastrointestinal conditions. Asthma or other breathing conditions. Restless legs syndrome, sleep apnea, or other sleep disorders. Chronic pain. Menopause. Stroke. Abuse of alcohol, tobacco, or illegal drugs. Mental health conditions, such as depression. Caffeine. Neurological disorders, such as Alzheimer's disease. An overactive thyroid (hyperthyroidism). Sometimes, the cause of insomnia may not be known. What increases the risk? Risk factors for insomnia include: Gender. Women are affected more often than men. Age. Insomnia is more common as you get older. Stress. Lack of exercise. Irregular work schedule or working night  shifts. Traveling between different time zones. Certain medical and mental health conditions. What are the signs or symptoms? If  you have insomnia, the main symptom is having trouble falling asleep or having trouble staying asleep. This may lead to other symptoms, such as: Feeling fatigued or having low energy. Feeling nervous about going to sleep. Not feeling rested in the morning. Having trouble concentrating. Feeling irritable, anxious, or depressed. How is this diagnosed? This condition may be diagnosed based on: Your symptoms and medical history. Your health care provider may ask about: Your sleep habits. Any medical conditions you have. Your mental health. A physical exam. How is this treated? Treatment for insomnia depends on the cause. Treatment may focus on treating an underlying condition that is causing insomnia. Treatment may also include: Medicines to help you sleep. Counseling or therapy. Lifestyle adjustments to help you sleep better. Follow these instructions at home: Eating and drinking  Limit or avoid alcohol, caffeinated beverages, and cigarettes, especially close to bedtime. These can disrupt your sleep. Do not eat a large meal or eat spicy foods right before bedtime. This can lead to digestive discomfort that can make it hard for you to sleep.  Sleep habits  Keep a sleep diary to help you and your health care provider figure out what could be causing your insomnia. Write down: When you sleep. When you wake up during the night. How well you sleep. How rested you feel the next day. Any side effects of medicines you are taking. What you eat and drink. Make your bedroom a dark, comfortable place where it is easy to fall asleep. Put up shades or blackout curtains to block light from outside. Use a white noise machine to block noise. Keep the temperature cool. Limit screen use before bedtime. This includes: Watching TV. Using your smartphone, tablet, or  computer. Stick to a routine that includes going to bed and waking up at the same times every day and night. This can help you fall asleep faster. Consider making a quiet activity, such as reading, part of your nighttime routine. Try to avoid taking naps during the day so that you sleep better at night. Get out of bed if you are still awake after 15 minutes of trying to sleep. Keep the lights down, but try reading or doing a quiet activity. When you feel sleepy, go back to bed.  General instructions Take over-the-counter and prescription medicines only as told by your health care provider. Exercise regularly, as told by your health care provider. Avoid exercise starting several hours before bedtime. Use relaxation techniques to manage stress. Ask your health care provider to suggest some techniques that may work well for you. These may include: Breathing exercises. Routines to release muscle tension. Visualizing peaceful scenes. Make sure that you drive carefully. Avoid driving if you feel very sleepy. Keep all follow-up visits as told by your health care provider. This is important. Contact a health care provider if: You are tired throughout the day. You have trouble in your daily routine due to sleepiness. You continue to have sleep problems, or your sleep problems get worse. Get help right away if: You have serious thoughts about hurting yourself or someone else. If you ever feel like you may hurt yourself or others, or have thoughts about taking your own life, get help right away. You can go to your nearest emergency department or call: Your local emergency services (911 in the [place] A suicide crisis helpline, such as the [facility] at 918-196-0921. This is open 24 hours a day. Summary Insomnia is a sleep disorder that makes it difficult  to fall asleep or stay asleep. Insomnia can be long-term (chronic) or short-term (acute). Treatment for insomnia depends  on the cause. Treatment may focus on treating an underlying condition that is causing insomnia. Keep a sleep diary to help you and your health care provider figure out what could be causing your insomnia. This information is not intended to replace advice given to you by your health care provider. Make sure you discuss any questions you have with your healthcare provider. Document Revised: 11/28/2019 Document Reviewed: 11/28/2019 Elsevier Patient Education  2022 [employer].

## 2020-09-11 ENCOUNTER — Other Ambulatory Visit: Payer: Self-pay | Admitting: Nurse Practitioner

## 2020-09-11 ENCOUNTER — Telehealth: Payer: Self-pay | Admitting: Nurse Practitioner

## 2020-09-11 DIAGNOSIS — E876 Hypokalemia: Secondary | ICD-10-CM

## 2020-09-11 DIAGNOSIS — I10 Essential (primary) hypertension: Secondary | ICD-10-CM

## 2020-09-11 LAB — COMPREHENSIVE METABOLIC PANEL
ALT: 12 IU/L (ref 0–32)
AST: 15 IU/L (ref 0–40)
Albumin/Globulin Ratio: 1.7 (ref 1.2–2.2)
Albumin: 4 g/dL (ref 3.8–4.9)
Alkaline Phosphatase: 132 IU/L — ABNORMAL HIGH (ref 44–121)
BUN/Creatinine Ratio: 10 (ref 9–23)
BUN: 9 mg/dL (ref 6–24)
Bilirubin Total: 0.3 mg/dL (ref 0.0–1.2)
CO2: 28 mmol/L (ref 20–29)
Calcium: 9.7 mg/dL (ref 8.7–10.2)
Chloride: 102 mmol/L (ref 96–106)
Creatinine, Ser: 0.91 mg/dL (ref 0.57–1.00)
Globulin, Total: 2.4 g/dL (ref 1.5–4.5)
Glucose: 154 mg/dL — ABNORMAL HIGH (ref 65–99)
Potassium: 2.8 mmol/L — ABNORMAL LOW (ref 3.5–5.2)
Sodium: 143 mmol/L (ref 134–144)
Total Protein: 6.4 g/dL (ref 6.0–8.5)
eGFR: 74 mL/min/{1.73_m2} (ref >59)

## 2020-09-11 LAB — CBC WITH DIFF/PLATELET
Basophils Absolute: 0 10*3/uL (ref 0.0–0.2)
Basos: 0 %
EOS (ABSOLUTE): 0.2 10*3/uL (ref 0.0–0.4)
Eos: 2 %
Hematocrit: 39.3 % (ref 34.0–46.6)
Hemoglobin: 12.8 g/dL (ref 11.1–15.9)
Immature Grans (Abs): 0 10*3/uL (ref 0.0–0.1)
Immature Granulocytes: 0 %
Lymphocytes Absolute: 1.5 10*3/uL (ref 0.7–3.1)
Lymphs: 16 %
MCH: 27.8 pg (ref 26.6–33.0)
MCHC: 32.6 g/dL (ref 31.5–35.7)
MCV: 85 fL (ref 79–97)
Monocytes Absolute: 0.6 10*3/uL (ref 0.1–0.9)
Monocytes: 6 %
Neutrophils Absolute: 6.9 10*3/uL (ref 1.4–7.0)
Neutrophils: 76 %
Platelets: 308 10*3/uL (ref 150–450)
RBC: 4.61 x10E6/uL (ref 3.77–5.28)
RDW: 13.9 % (ref 11.7–15.4)
WBC: 9.2 10*3/uL (ref 3.4–10.8)

## 2020-09-11 LAB — VITAMIN D 25 HYDROXY (VIT D DEFICIENCY, FRACTURES): Vit D, 25-Hydroxy: 33.4 ng/mL (ref 30.0–100.0)

## 2020-09-11 LAB — LIPID PANEL
Chol/HDL Ratio: 4.6 ratio — ABNORMAL HIGH (ref 0.0–4.4)
Cholesterol, Total: 165 mg/dL (ref 100–199)
HDL: 36 mg/dL — ABNORMAL LOW (ref >39)
LDL Chol Calc (NIH): 109 mg/dL — ABNORMAL HIGH (ref 0–99)
Triglycerides: 107 mg/dL (ref 0–149)
VLDL Cholesterol Cal: 20 mg/dL (ref 5–40)

## 2020-09-11 LAB — CARDIOVASCULAR RISK ASSESSMENT

## 2020-09-11 MED ORDER — POTASSIUM CHLORIDE CRYS ER 20 MEQ PO TBCR: 20.0000 meq | EXTENDED_RELEASE_TABLET | Freq: Two times a day (BID) | ORAL | 3 refills | Status: AC

## 2020-09-11 MED ORDER — VALSARTAN 80 MG PO TABS: 80.0000 mg | ORAL_TABLET | Freq: Every day | ORAL | 3 refills | Status: DC

## 2020-09-11 NOTE — Telephone Encounter (Addendum)
Pt telephoned concerning hypokalemia per labs K+2.8.Pt denies CP, arrhythmias, or dyspnea. Pt instructed to stop Diovan, begin Valsartan 80 mg daily and Potassium 20 meq BID. Pt instructed to seek emergency medical care for arrhythmias, chest pain, severe nausea or vomiting, or any other concerning symptoms.Pt acknowledged understanding of all instructions.

## 2020-09-15 LAB — SPECIMEN STATUS REPORT

## 2020-09-15 LAB — HEMOGLOBIN A1C
Est. average glucose Bld gHb Est-mCnc: 128 mg/dL
Hgb A1c MFr Bld: 6.1 % — ABNORMAL HIGH (ref 4.8–5.6)

## 2020-09-16 ENCOUNTER — Other Ambulatory Visit: Payer: Self-pay

## 2020-09-16 ENCOUNTER — Encounter: Payer: Self-pay | Admitting: Nurse Practitioner

## 2020-09-18 ENCOUNTER — Encounter: Payer: Self-pay | Admitting: Nurse Practitioner

## 2020-09-21 ENCOUNTER — Encounter: Payer: Self-pay | Admitting: Nurse Practitioner

## 2020-09-21 NOTE — Telephone Encounter (Signed)
Pt left VM stating she is continuing to carry 10 lbs. States it is in fingers, ankles, and legs. BP has been fine. She has been drinking water and avoiding salt. States last time she took valsartan she also took diuretic, but potassium is low now that she cannot. Also states one hour after taking medication she gets bad headache. Has not been taking medication for sleep due to waking up still "groggy." Requesting recommendations on how to get fluid off.   Lorita Officer, [place] 09/21/20 11:39 AM

## 2020-09-22 ENCOUNTER — Telehealth: Payer: Self-pay

## 2020-09-22 NOTE — Telephone Encounter (Signed)
Called pt per her request. Pt states she has been urinating more frequently and some weight has come off. She is doing training sessions currently for her job and with recently taking off for her husband she would not be able to take more time off at this time. She will be coming into office at 8 am on Thursday for potassium recheck. She does state when she took valsartan previously it did this as well until diuretic was added. Pt denies chest pain and discoloration in area of edema. States BP after walking today was 130s/70s. States it has been staying normal range. Pt would like to keep appointment with Carollee Herter NP on 30th. If more symptoms occur pt was advised to call back to see another provider. Pt VU, but denies seeing another provider at this time due to continuity and schedule.   Lorita Officer, [place] 09/22/20 2:14 PM

## 2020-09-24 ENCOUNTER — Encounter: Payer: Self-pay | Admitting: Legal Medicine

## 2020-09-24 ENCOUNTER — Ambulatory Visit (INDEPENDENT_AMBULATORY_CARE_PROVIDER_SITE_OTHER): Payer: 59 | Admitting: Legal Medicine

## 2020-09-24 ENCOUNTER — Other Ambulatory Visit: Payer: 59

## 2020-09-24 ENCOUNTER — Other Ambulatory Visit: Payer: Self-pay

## 2020-09-24 VITALS — BP 140/88 | HR 72 | Temp 97.4°F | Ht 64.0 in | Wt 256.4 lb

## 2020-09-24 DIAGNOSIS — K219 Gastro-esophageal reflux disease without esophagitis: Secondary | ICD-10-CM

## 2020-09-24 DIAGNOSIS — Z6841 Body Mass Index (BMI) 40.0 and over, adult: Secondary | ICD-10-CM | POA: Diagnosis not present

## 2020-09-24 DIAGNOSIS — I10 Essential (primary) hypertension: Secondary | ICD-10-CM | POA: Insufficient documentation

## 2020-09-24 DIAGNOSIS — E876 Hypokalemia: Secondary | ICD-10-CM | POA: Diagnosis not present

## 2020-09-24 MED ORDER — VALSARTAN-HYDROCHLOROTHIAZIDE 160-12.5 MG PO TABS: 1.0000 | ORAL_TABLET | Freq: Every day | ORAL | 3 refills | Status: DC

## 2020-09-24 NOTE — Progress Notes (Signed)
Established Patient Office Visit  Subjective:  Patient ID: Gina Combs, female    DOB: 1966-01-07  Age: 55 y.o. MRN: [id number]  CC:  Chief Complaint  Patient presents with   Weight Gain    Patient states she was here 2 weeks ago and Larene Beach switched her to just plain valsartan. When she began taking the valsartan without the diuretic she stays full of gas, and "blew up like a balloon." Patient states she has not been able to take the full 2 tablets of potassium but states she supplements foods with lots of protein. Patient states yesterday she was constantly going to the bathroom and had to leave work due to going to the bathroom so much.    HPI Gina Combs presents for chronic visit  Patient presents for follow up of hypertension.  Patient tolerating valsartan well with side effects.  Patient was diagnosed with hypertension 2016 so has been treated for hypertension for 8 years.Patient is working on maintaining diet and exercise regimen and follows up as directed. Complication include none  Hypokalemia on potassium  She is having gas with valsartan alone..   Bilateral knee OA  Past Medical History:  Diagnosis Date   Arthritis    bilateral knee osteoarthritis   Essential hypertension    GERD (gastroesophageal reflux disease)    Menopausal and female climacteric states    RLS (restless legs syndrome)     History reviewed. No pertinent surgical history.  Family History  Problem Relation Age of Onset   Cancer Maternal Grandmother        ovarian   Cancer Maternal Aunt        ovarian    Social History   Socioeconomic History   Marital status: Married    Spouse name: Not on file   Number of children: 2   Years of education: Not on file   Highest education level: Not on file  Occupational History   Not on file  Tobacco Use   Smoking status: Never   Smokeless tobacco: Never  Vaping Use   Vaping Use: Never used  Substance and Sexual Activity   Alcohol use:  Never   Drug use: Never   Sexual activity: Not on file  Other Topics Concern   Not on file  Social History Narrative   ** Merged History Encounter **       Social Determinants of Health   Financial Resource Strain: Not on file  Food Insecurity: Not on file  Transportation Needs: Not on file  Physical Activity: Not on file  Stress: Not on file  Social Connections: Not on file  Intimate Partner Violence: Not on file    Outpatient Medications Prior to Visit  Medication Sig Dispense Refill   albuterol (VENTOLIN HFA) 108 (90 Base) MCG/ACT inhaler 2-4 puffs Q4 PRN SOB or tight cough     celecoxib (CELEBREX) 100 MG capsule Take 1 capsule (100 mg total) by mouth 2 (two) times daily. 180 capsule 1   CRANBERRY EXTRACT PO Take by mouth.     ketoconazole (NIZORAL) 2 % cream Apply 1 application topically daily. 15 g 0   Nutritional Supplements (VITAMIN D BOOSTER PO) Take by mouth.     omeprazole (PRILOSEC) 20 MG capsule Take 20 mg by mouth daily.     potassium chloride SA (KLOR-CON) 20 MEQ tablet Take 1 tablet (20 mEq total) by mouth 2 (two) times daily. 180 tablet 3   Probiotic Product (PROBIOTIC ADVANCED PO) Take by mouth.  sertraline (ZOLOFT) 25 MG tablet Take 1 tablet (25 mg total) by mouth daily. Please call to set up a follow up appointment in June 2021. Thank you, Dr Tobie Poet 90 tablet 0   traZODone (DESYREL) 50 MG tablet Take 0.5-1 tablets (25-50 mg total) by mouth at bedtime as needed for sleep. 30 tablet 3   valsartan (DIOVAN) 80 MG tablet Take 1 tablet (80 mg total) by mouth daily. 90 tablet 3   No facility-administered medications prior to visit.    Allergies  Allergen Reactions   Codeine Hives   Penicillins Hives    ROS Review of Systems  Constitutional:  Negative for activity change and appetite change.  HENT:  Negative for congestion.   Eyes:  Negative for visual disturbance.  Respiratory:  Negative for chest tightness and shortness of breath.   Cardiovascular:   Negative for chest pain, palpitations and leg swelling.  Gastrointestinal:  Positive for diarrhea. Negative for abdominal distention and abdominal pain.  Endocrine: Negative for polyuria.  Genitourinary:  Negative for difficulty urinating and dysuria.  Musculoskeletal:  Negative for arthralgias and back pain.  Skin: Negative.   Psychiatric/Behavioral:  Negative for agitation and behavioral problems.      Objective:    Physical Exam Vitals reviewed.  Constitutional:      General: She is not in acute distress.    Appearance: Normal appearance. She is obese.  HENT:     Right Ear: Tympanic membrane, ear canal and external ear normal.     Left Ear: Tympanic membrane, ear canal and external ear normal.  Eyes:     Extraocular Movements: Extraocular movements intact.     Conjunctiva/sclera: Conjunctivae normal.     Pupils: Pupils are equal, round, and reactive to light.  Cardiovascular:     Rate and Rhythm: Normal rate and regular rhythm.     Pulses: Normal pulses.     Heart sounds: Normal heart sounds. No murmur heard.   No gallop.  Pulmonary:     Effort: Pulmonary effort is normal. No respiratory distress.     Breath sounds: Normal breath sounds. No wheezing.  Abdominal:     General: Abdomen is flat. Bowel sounds are normal. There is no distension.     Palpations: Abdomen is soft.     Tenderness: There is no abdominal tenderness.  Musculoskeletal:        General: Tenderness (both knees) present.     Cervical back: Normal range of motion.     Right lower leg: No edema.     Left lower leg: No edema.  Skin:    General: Skin is warm.     Capillary Refill: Capillary refill takes less than 2 seconds.  Neurological:     General: No focal deficit present.     Mental Status: She is alert and oriented to person, place, and time. Mental status is at baseline.  Psychiatric:        Mood and Affect: Mood normal.        Thought Content: Thought content normal.        Judgment: Judgment  normal.    BP 140/88   Pulse 72   Temp (!) 97.4 F (36.3 C)   Ht _0  (1.626 m)   Wt 256 lb 6.4 oz (116.3 kg)   SpO2 98%   BMI 44.01 kg/m  Wt Readings from Last 3 Encounters:  09/24/20 256 lb 6.4 oz (116.3 kg)  09/10/20 243 lb (110.2 kg)  04/10/20 259 lb (117.5 kg)  Health Maintenance Due  Topic Date Due   COVID-19 Vaccine (1) Never done   HIV Screening  Never done   Hepatitis C Screening  Never done   TETANUS/TDAP  Never done   PAP SMEAR-Modifier  Never done   COLONOSCOPY (Pts 45-24yr Insurance coverage will need to be confirmed)  Never done   MAMMOGRAM  Never done   Zoster Vaccines- Shingrix (1 of 2) Never done   INFLUENZA VACCINE  08/31/2020    There are no preventive care reminders to display for this patient.  Lab Results  Component Value Date   TSH 3.580 04/10/2020   Lab Results  Component Value Date   WBC 9.2 09/10/2020   HGB 12.8 09/10/2020   HCT 39.3 09/10/2020   MCV 85 09/10/2020   PLT 308 09/10/2020   Lab Results  Component Value Date   NA 143 09/10/2020   K 2.8 (L) 09/10/2020   CO2 28 09/10/2020   GLUCOSE 154 (H) 09/10/2020   BUN 9 09/10/2020   CREATININE 0.91 09/10/2020   BILITOT 0.3 09/10/2020   ALKPHOS 132 (H) 09/10/2020   AST 15 09/10/2020   ALT 12 09/10/2020   PROT 6.4 09/10/2020   ALBUMIN 4.0 09/10/2020   CALCIUM 9.7 09/10/2020   EGFR 74 09/10/2020   Lab Results  Component Value Date   CHOL 165 09/10/2020   Lab Results  Component Value Date   HDL 36 (L) 09/10/2020   Lab Results  Component Value Date   LDLCALC 109 (H) 09/10/2020   Lab Results  Component Value Date   TRIG 107 09/10/2020   Lab Results  Component Value Date   CHOLHDL 4.6 (H) 09/10/2020   Lab Results  Component Value Date   HGBA1C 6.1 (H) 09/10/2020      Assessment & Plan:   Diagnoses and all orders for this visit: Hypokalemia patient is on potassium supplements -     Comprehensive metabolic panel patient is on potassium supplements needs  potassium checked.  She ate a lot of salt on her vacation and this probably caused the hypokalemia -     Comprehensive metabolic panel  Essential hypertension, benign -     valsartan-hydrochlorothiazide (DIOVAN-HCT) 160-12.5 MG tablet; Take 1 tablet by mouth daily. An individual hypertension care plan was established and reinforced today.  The patient's status was assessed using clinical findings on exam and labs or diagnostic tests. The patient's success at meeting treatment goals on disease specific evidence-based guidelines and found to be fair controlled. SELF MANAGEMENT: The patient and I together assessed ways to personally work towards obtaining the recommended goals. Medicine changed to valsartan 160, hCTZ 12.5 RECOMMENDATIONS: avoid decongestants found in common cold remedies, decrease consumption of alcohol, perform routine monitoring of BP with home BP cuff, exercise, reduction of dietary salt, take medicines as prescribed, try not to miss doses and quit smoking.  Regular exercise and maintaining a healthy weight is needed.  Stress reduction may help. A CLINICAL SUMMARY including written plan identify barriers to care unique to individual due to social or financial issues.  We attempt to mutually creat solutions for individual and family understanding.   BMI 40.0-44.9, adult ([facility] An individualize plan was formulated for obesity using patient history and physical exam to encourage weight loss.  An evidence based program was formulated.  Patient is to cut portion size  with meals and to plan physical exercise 3 days a week at least 20 minutes.  Weight watchers and other programs are helpful.  Planned  amount of weight loss 10 lbs.   Morbid obesity ([place]) An individualize plan was formulated for obesity using patient history and physical exam to encourage weight loss.  An evidence based program was formulated.  Patient is to cut portion size  with meals and to plan physical exercise 3 days a  week at least 20 minutes.  Weight watchers and other programs are helpful.  Planned amount of weight loss 10 lbs.    Gastroesophageal reflux disease, unspecified whether esophagitis present  Plan of care was formulated today.  he is doing well.  A plan of care was formulated using patient exam, tests and other sources to optimize care using evidence based information.  Recommend no smoking, no eating after supper, avoid fatty foods, elevate Head of bed, avoid tight fitting clothing.  Continue on omeprazole.     Follow-up: Return in about 3 weeks (around 10/15/2020) for for BP.    Reinaldo Meeker, MD

## 2020-09-25 LAB — COMPREHENSIVE METABOLIC PANEL
ALT: 20 IU/L (ref 0–32)
AST: 19 IU/L (ref 0–40)
Albumin/Globulin Ratio: 1.6 (ref 1.2–2.2)
Albumin: 3.9 g/dL (ref 3.8–4.9)
Alkaline Phosphatase: 125 IU/L — ABNORMAL HIGH (ref 44–121)
BUN/Creatinine Ratio: 6 — ABNORMAL LOW (ref 9–23)
BUN: 7 mg/dL (ref 6–24)
Bilirubin Total: 0.5 mg/dL (ref 0.0–1.2)
CO2: 23 mmol/L (ref 20–29)
Calcium: 9.6 mg/dL (ref 8.7–10.2)
Chloride: 105 mmol/L (ref 96–106)
Creatinine, Ser: 1.18 mg/dL — ABNORMAL HIGH (ref 0.57–1.00)
Globulin, Total: 2.4 g/dL (ref 1.5–4.5)
Glucose: 119 mg/dL — ABNORMAL HIGH (ref 65–99)
Potassium: 4 mmol/L (ref 3.5–5.2)
Sodium: 144 mmol/L (ref 134–144)
Total Protein: 6.3 g/dL (ref 6.0–8.5)
eGFR: 54 mL/min/{1.73_m2} — ABNORMAL LOW (ref >59)

## 2020-09-25 NOTE — Progress Notes (Signed)
Glucose 119, kidney test stage 3a, liver test normal, potassium 4.0 normal level lp

## 2020-09-29 ENCOUNTER — Ambulatory Visit: Payer: 59 | Admitting: Nurse Practitioner

## 2020-10-06 ENCOUNTER — Other Ambulatory Visit: Payer: Self-pay | Admitting: Nurse Practitioner

## 2020-10-06 DIAGNOSIS — Z1231 Encounter for screening mammogram for malignant neoplasm of breast: Secondary | ICD-10-CM

## 2020-10-13 ENCOUNTER — Ambulatory Visit: Payer: 59

## 2020-11-20 ENCOUNTER — Encounter: Payer: Self-pay | Admitting: Nurse Practitioner

## 2020-11-23 ENCOUNTER — Ambulatory Visit
Admission: RE | Admit: 2020-11-23 | Discharge: 2020-11-23 | Disposition: A | Discharge status: Home / Self Care | Payer: 59 | Source: Ambulatory Visit | Attending: Nurse Practitioner | Admitting: Nurse Practitioner

## 2020-11-23 ENCOUNTER — Other Ambulatory Visit: Payer: Self-pay

## 2020-11-23 ENCOUNTER — Inpatient Hospital Stay: Admission: RE | Admit: 2020-11-23 | Payer: 59 | Source: Ambulatory Visit

## 2020-12-02 ENCOUNTER — Ambulatory Visit: Payer: 59 | Admitting: Nurse Practitioner

## 2020-12-18 ENCOUNTER — Ambulatory Visit: Payer: 59 | Admitting: Nurse Practitioner

## 2020-12-29 ENCOUNTER — Ambulatory Visit: Payer: 59 | Admitting: Nurse Practitioner

## 2021-01-01 ENCOUNTER — Ambulatory Visit: Payer: 59 | Admitting: Nurse Practitioner

## 2021-01-15 NOTE — Progress Notes (Deleted)
Subjective:  Patient ID: Gina Combs, female    DOB: 09-18-1965  Age: 55 y.o. MRN: [id number]  Chief Complaint  Patient presents with   Hypertension   Gastroesophageal Reflux    HPI    She was last seen for hypertension 3 months ago.  BP at that visit was 132/68. Management since that visit includes Diovan 160-12.5 mg daily.  She reports excellent compliance with treatment. She {is/is not:9024} having side effects. {document side effects if present:1} She is following a {diet:21022986} diet. She {is/is not:9024} exercising. She {does/does not:200015} smoke.  Use of agents associated with hypertension: {bp agents assoc with hypertension:511::"none"}.   Outside blood pressures are {***enter patient reported home BP readings, or 'not being checked':1}. Symptoms: {Yes/No:20286} chest pain {Yes/No:20286} chest pressure  {Yes/No:20286} palpitations {Yes/No:20286} syncope  {Yes/No:20286} dyspnea {Yes/No:20286} orthopnea  {Yes/No:20286} paroxysmal nocturnal dyspnea {Yes/No:20286} lower extremity edema   Pertinent labs: Lab Results  Component Value Date   CHOL 165 09/10/2020   HDL 36 (L) 09/10/2020   LDLCALC 109 (H) 09/10/2020   TRIG 107 09/10/2020   CHOLHDL 4.6 (H) 09/10/2020   Lab Results  Component Value Date   NA 144 09/24/2020   K 4.0 09/24/2020   CREATININE 1.18 (H) 09/24/2020   EGFR 54 (L) 09/24/2020   GLUCOSE 119 (H) 09/24/2020     The 10-year ASCVD risk score (Arnett DK, et al., 2019) is: 4.6%   Current Outpatient Medications on File Prior to Visit  Medication Sig Dispense Refill   albuterol (VENTOLIN HFA) 108 (90 Base) MCG/ACT inhaler 2-4 puffs Q4 PRN SOB or tight cough     celecoxib (CELEBREX) 100 MG capsule Take 1 capsule (100 mg total) by mouth 2 (two) times daily. 180 capsule 1   CRANBERRY EXTRACT PO Take by mouth.     ketoconazole (NIZORAL) 2 % cream Apply 1 application topically daily. 15 g 0   Nutritional Supplements (VITAMIN D BOOSTER PO) Take by  mouth.     omeprazole (PRILOSEC) 20 MG capsule Take 20 mg by mouth daily.     potassium chloride SA (KLOR-CON) 20 MEQ tablet Take 1 tablet (20 mEq total) by mouth 2 (two) times daily. 180 tablet 3   Probiotic Product (PROBIOTIC ADVANCED PO) Take by mouth.     sertraline (ZOLOFT) 25 MG tablet Take 1 tablet (25 mg total) by mouth daily. Please call to set up a follow up appointment in June 2021. Thank you, Dr Tobie Poet 90 tablet 0   traZODone (DESYREL) 50 MG tablet Take 0.5-1 tablets (25-50 mg total) by mouth at bedtime as needed for sleep. 30 tablet 3   valsartan-hydrochlorothiazide (DIOVAN-HCT) 160-12.5 MG tablet Take 1 tablet by mouth daily. 90 tablet 3   No current facility-administered medications on file prior to visit.   Past Medical History:  Diagnosis Date   Arthritis    bilateral knee osteoarthritis   Essential hypertension    GERD (gastroesophageal reflux disease)    Menopausal and female climacteric states    RLS (restless legs syndrome)    No past surgical history on file.  Family History  Problem Relation Age of Onset   Cancer Maternal Grandmother        ovarian   Cancer Maternal Aunt        ovarian   Social History   Socioeconomic History   Marital status: Married    Spouse name: Not on file   Number of children: 2   Years of education: Not on file   Highest  education level: Not on file  Occupational History   Not on file  Tobacco Use   Smoking status: Never   Smokeless tobacco: Never  Vaping Use   Vaping Use: Never used  Substance and Sexual Activity   Alcohol use: Never   Drug use: Never   Sexual activity: Not on file  Other Topics Concern   Not on file  Social History Narrative   ** Merged History Encounter **       Social Determinants of Health   Financial Resource Strain: Not on file  Food Insecurity: Not on file  Transportation Needs: Not on file  Physical Activity: Not on file  Stress: Not on file  Social Connections: Not on file    Review  of Systems  Constitutional:  Negative for chills, fatigue and fever.  HENT:  Negative for congestion, ear pain, rhinorrhea and sore throat.   Respiratory:  Negative for cough and shortness of breath.   Cardiovascular:  Negative for chest pain.  Gastrointestinal:  Negative for abdominal pain, constipation, diarrhea, nausea and vomiting.  Genitourinary:  Negative for dysuria and urgency.  Musculoskeletal:  Negative for back pain and myalgias.  Neurological:  Negative for dizziness, weakness, light-headedness and headaches.  Psychiatric/Behavioral:  Negative for dysphoric mood. The patient is not nervous/anxious.     Objective:  There were no vitals taken for this visit.  BP/Weight 09/24/2020 09/10/2020 09/07/8108  Systolic BP 315 945 859  Diastolic BP 88 68 68  Wt. (Lbs) 256.4 243 259  BMI 44.01 41.71 44.46    Physical Exam  Diabetic Foot Exam - Simple   No data filed      Lab Results  Component Value Date   WBC 9.2 09/10/2020   HGB 12.8 09/10/2020   HCT 39.3 09/10/2020   PLT 308 09/10/2020   GLUCOSE 119 (H) 09/24/2020   CHOL 165 09/10/2020   TRIG 107 09/10/2020   HDL 36 (L) 09/10/2020   LDLCALC 109 (H) 09/10/2020   ALT 20 09/24/2020   AST 19 09/24/2020   NA 144 09/24/2020   K 4.0 09/24/2020   CL 105 09/24/2020   CREATININE 1.18 (H) 09/24/2020   BUN 7 09/24/2020   CO2 23 09/24/2020   TSH 3.580 04/10/2020   HGBA1C 6.1 (H) 09/10/2020      Assessment & Plan:   Problem List Items Addressed This Visit       Cardiovascular and Mediastinum   Essential hypertension, benign - Primary     Digestive   Gastroesophageal reflux disease   Other Visit Diagnoses     Depression, major, single episode, moderate (HCC)       Insomnia, unspecified type         .  No orders of the defined types were placed in this encounter.   No orders of the defined types were placed in this encounter.    Follow-up: No follow-ups on file.  An After Visit Summary was printed and  given to the patient.  Rip Harbour, NP [place] [phone]

## 2021-01-21 ENCOUNTER — Ambulatory Visit (INDEPENDENT_AMBULATORY_CARE_PROVIDER_SITE_OTHER): Payer: 59 | Admitting: Nurse Practitioner

## 2021-01-21 DIAGNOSIS — G4700 Insomnia, unspecified: Secondary | ICD-10-CM

## 2021-01-21 DIAGNOSIS — I10 Essential (primary) hypertension: Secondary | ICD-10-CM

## 2021-01-21 DIAGNOSIS — K219 Gastro-esophageal reflux disease without esophagitis: Secondary | ICD-10-CM

## 2021-01-21 DIAGNOSIS — F321 Major depressive disorder, single episode, moderate: Secondary | ICD-10-CM

## 2021-02-20 ENCOUNTER — Other Ambulatory Visit: Payer: Self-pay | Admitting: Nurse Practitioner

## 2021-02-20 DIAGNOSIS — G4700 Insomnia, unspecified: Secondary | ICD-10-CM

## 2021-02-20 DIAGNOSIS — M130 Polyarthritis, unspecified: Secondary | ICD-10-CM

## 2021-07-11 NOTE — Progress Notes (Signed)
Cancelled.  

## 2021-08-17 ENCOUNTER — Other Ambulatory Visit: Payer: Self-pay | Admitting: Legal Medicine

## 2021-08-17 DIAGNOSIS — I10 Essential (primary) hypertension: Secondary | ICD-10-CM

## 2021-09-02 ENCOUNTER — Other Ambulatory Visit: Payer: Self-pay | Admitting: Legal Medicine

## 2021-09-02 DIAGNOSIS — I10 Essential (primary) hypertension: Secondary | ICD-10-CM

## 2022-02-17 IMAGING — MG MM DIGITAL SCREENING BILAT W/ TOMO AND CAD
8 series · 8 of 24 positions shown · non-contrast
Comparison: None.

ACR Breast Density Category a: The breast tissue is almost entirely
fatty.

CLINICAL DATA: Screening.

EXAM:
DIGITAL SCREENING BILATERAL MAMMOGRAM WITH TOMOSYNTHESIS AND CAD
TECHNIQUE: Bilateral screening digital craniocaudal and mediolateral oblique
mammograms were obtained. Bilateral screening digital breast
tomosynthesis was performed. The images were evaluated with
computer-aided detection.

[L MLO synth-2D]
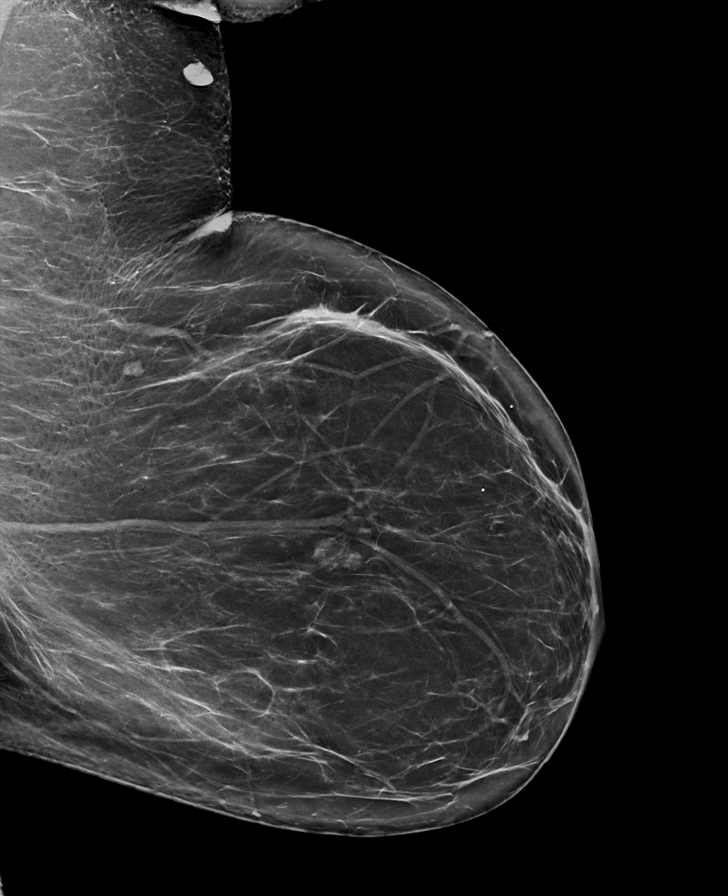

[R CC synth-2D]
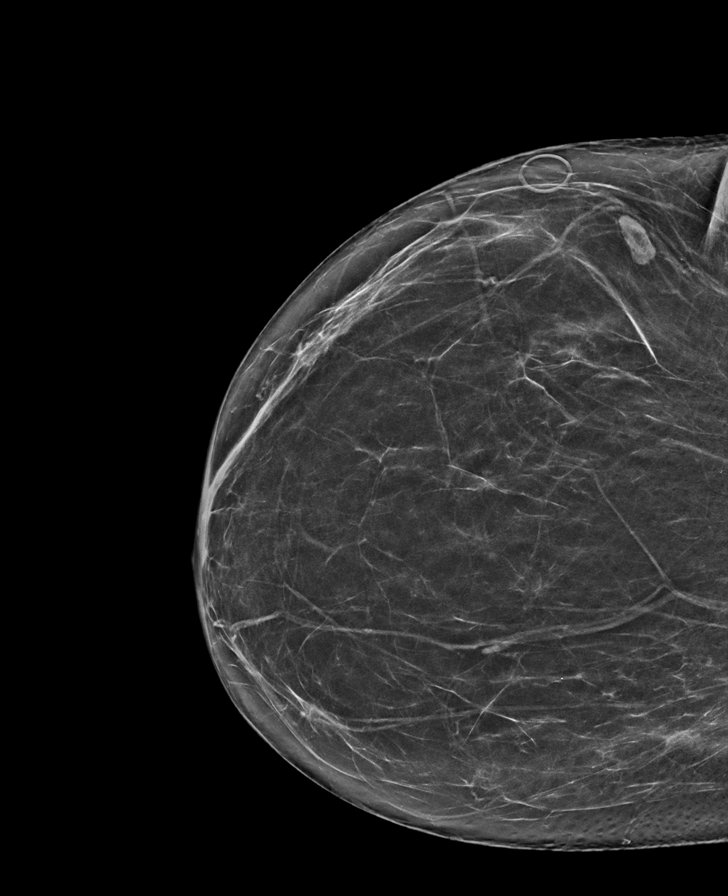

[R MLO synth-2D]
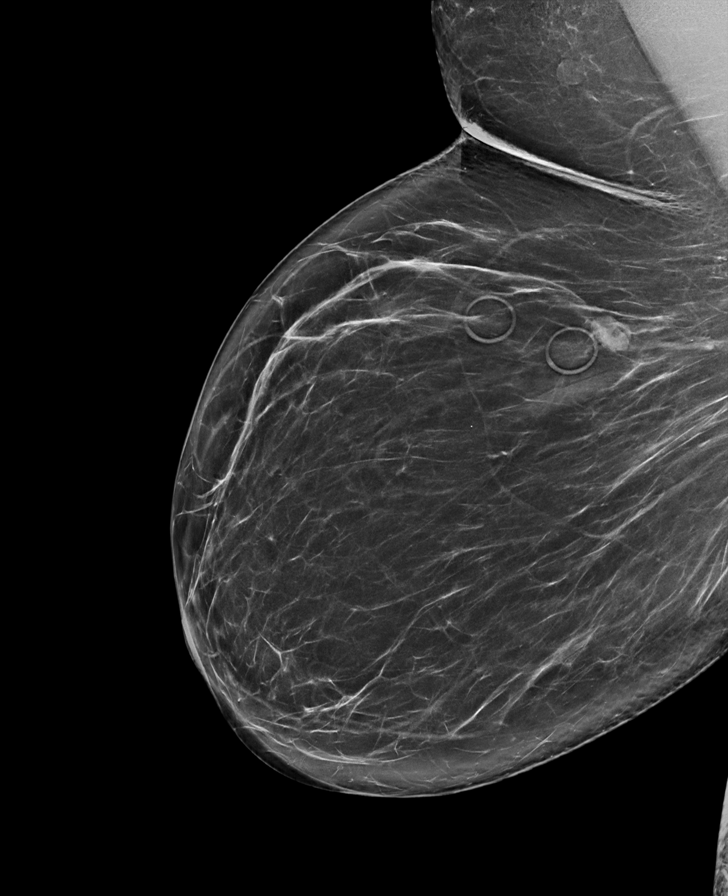

[L CC synth-2D]
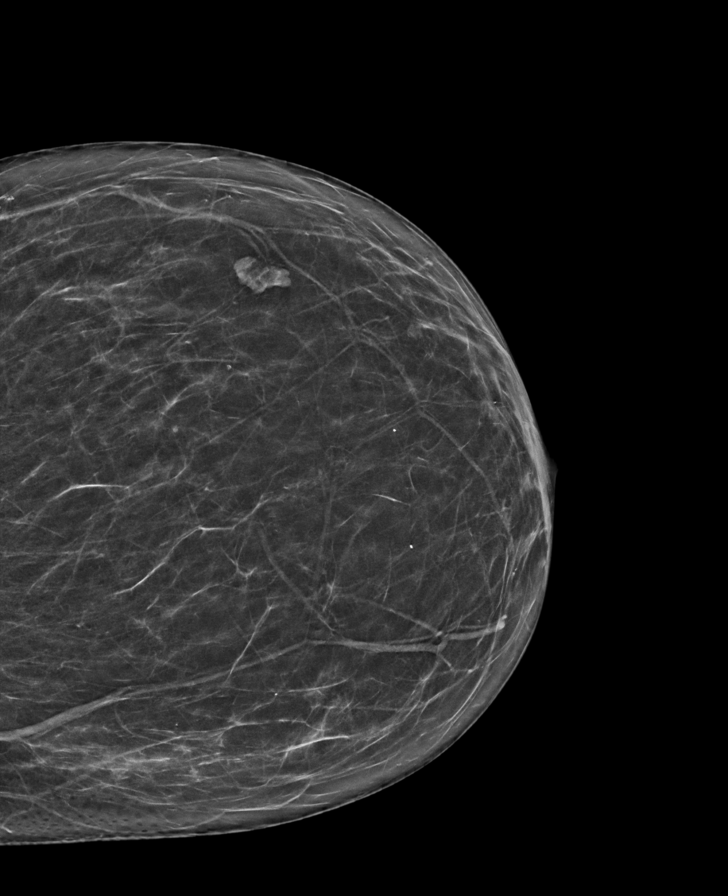

[L MLO tomo · tomo slice 41/80.0]
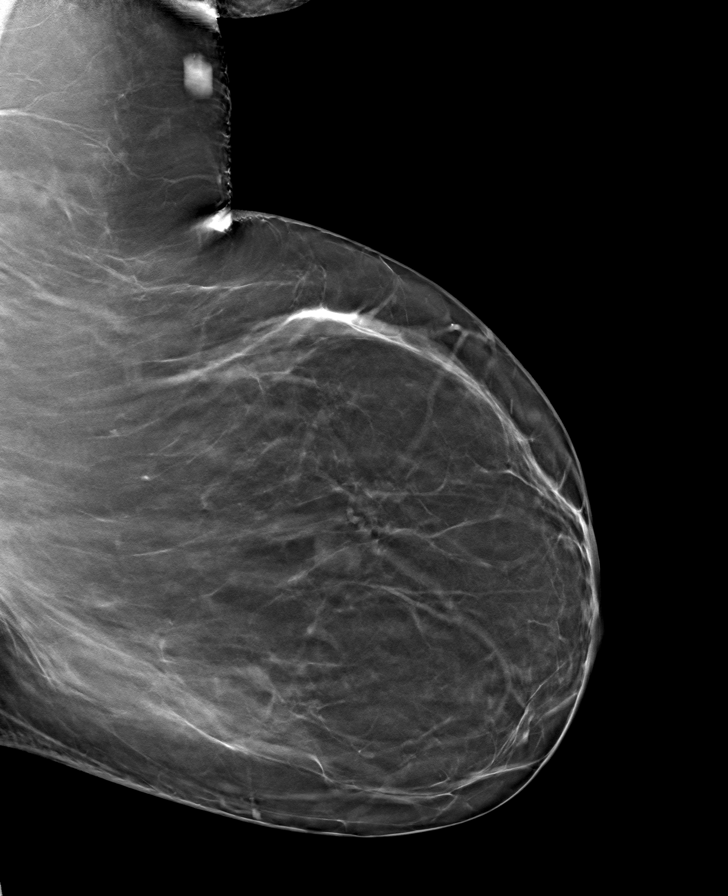

[L CC tomo · tomo slice 33/65.0]
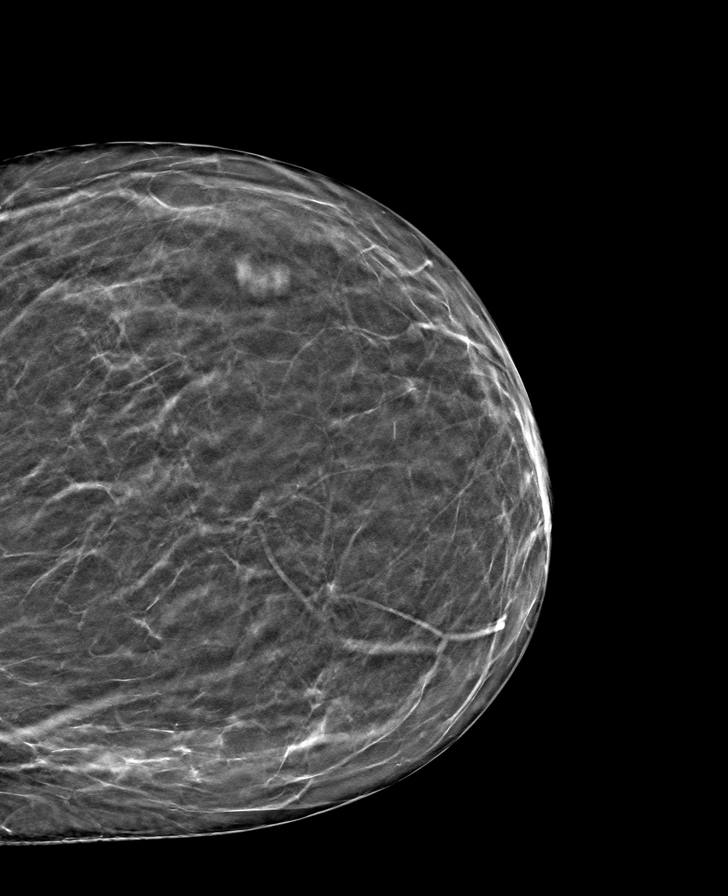

[R CC tomo · tomo slice 37/72.0]
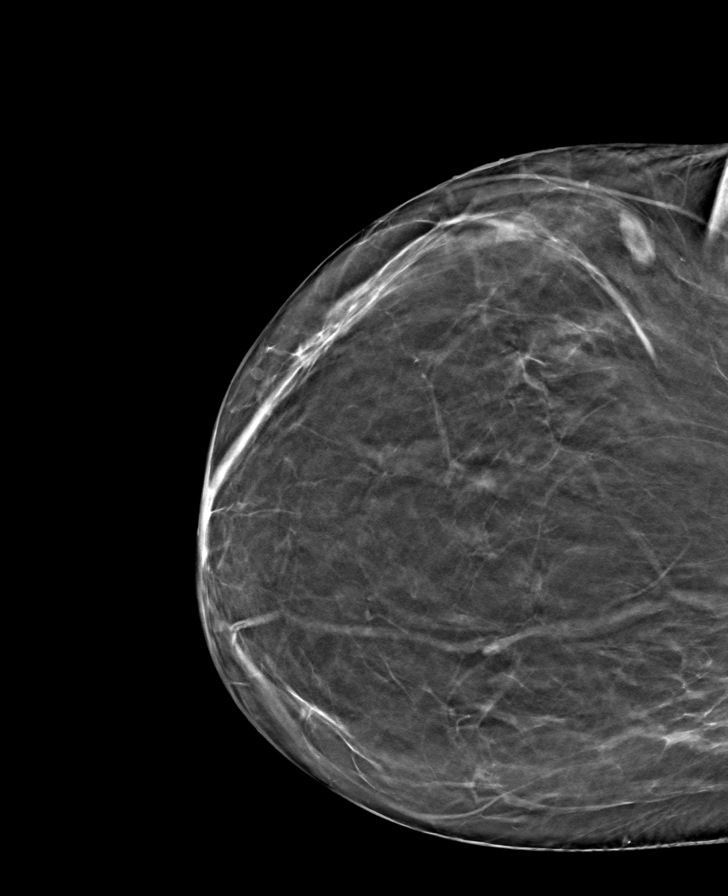

[R MLO tomo · tomo slice 43/85.0]
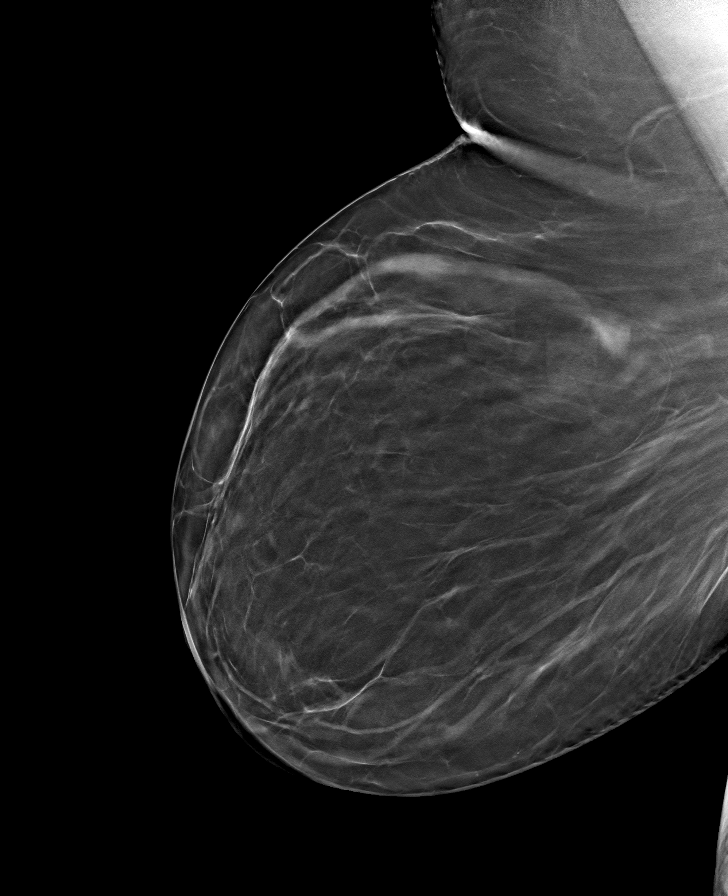

[8 of 24 positions shown; findings below may reference images not displayed]

FINDINGS: There are no findings suspicious for malignancy.
IMPRESSION: No mammographic evidence of malignancy. A result letter of this
screening mammogram will be mailed directly to the patient.

RECOMMENDATION:
Screening mammogram in one year. (Code:44-M-M6Q)

BI-RADS CATEGORY  1: Negative.

## 2022-06-13 ENCOUNTER — Other Ambulatory Visit: Payer: Self-pay

## 2022-06-13 DIAGNOSIS — I10 Essential (primary) hypertension: Secondary | ICD-10-CM

## 2022-09-06 ENCOUNTER — Other Ambulatory Visit: Payer: Self-pay

## 2022-09-08 ENCOUNTER — Other Ambulatory Visit: Payer: Self-pay
